# Patient Record
Sex: Male | Born: 1955
Health system: Southern US, Community
[De-identification: ages and names within clinical notes are randomized; demographics above are authoritative.]

## PROBLEM LIST (undated history)

## (undated) DIAGNOSIS — R7301 Impaired fasting glucose: Secondary | ICD-10-CM

## (undated) DIAGNOSIS — R0989 Other specified symptoms and signs involving the circulatory and respiratory systems: Secondary | ICD-10-CM

## (undated) DIAGNOSIS — F17200 Nicotine dependence, unspecified, uncomplicated: Secondary | ICD-10-CM

## (undated) DIAGNOSIS — A630 Anogenital (venereal) warts: Secondary | ICD-10-CM

## (undated) DIAGNOSIS — M109 Gout, unspecified: Secondary | ICD-10-CM

## (undated) DIAGNOSIS — K219 Gastro-esophageal reflux disease without esophagitis: Secondary | ICD-10-CM

## (undated) DIAGNOSIS — J309 Allergic rhinitis, unspecified: Secondary | ICD-10-CM

## (undated) DIAGNOSIS — I10 Essential (primary) hypertension: Secondary | ICD-10-CM

## (undated) DIAGNOSIS — E785 Hyperlipidemia, unspecified: Secondary | ICD-10-CM

## (undated) DIAGNOSIS — F419 Anxiety disorder, unspecified: Secondary | ICD-10-CM

## (undated) HISTORY — PX: OTHER SURGICAL HISTORY: SHX169

## (undated) HISTORY — DX: Impaired fasting glucose: R73.01

## (undated) HISTORY — DX: Hyperlipidemia, unspecified: E78.5

## (undated) HISTORY — DX: Essential (primary) hypertension: I10

## (undated) HISTORY — DX: Anxiety disorder, unspecified: F41.9

## (undated) HISTORY — DX: Gastro-esophageal reflux disease without esophagitis: K21.9

## (undated) HISTORY — DX: Anogenital (venereal) warts: A63.0

## (undated) HISTORY — DX: Gout, unspecified: M10.9

## (undated) HISTORY — PX: ESOPHAGOGASTRODUODENOSCOPY: SHX1529

## (undated) HISTORY — DX: Nicotine dependence, unspecified, uncomplicated: F17.200

## (undated) HISTORY — DX: Allergic rhinitis, unspecified: J30.9

## (undated) HISTORY — DX: Other specified symptoms and signs involving the circulatory and respiratory systems: R09.89

---

## 2011-05-19 HISTORY — PX: SKIN CANCER EXCISION: SHX779

## 2011-06-28 ENCOUNTER — Encounter: Payer: Self-pay | Admitting: Family Medicine

## 2011-06-28 ENCOUNTER — Telehealth: Payer: Self-pay | Admitting: *Deleted

## 2011-06-28 ENCOUNTER — Ambulatory Visit (INDEPENDENT_AMBULATORY_CARE_PROVIDER_SITE_OTHER): Payer: BC Managed Care – PPO | Admitting: Family Medicine

## 2011-06-28 VITALS — BP 220/127 | HR 78 | Ht 73.0 in | Wt 172.0 lb

## 2011-06-28 DIAGNOSIS — F17200 Nicotine dependence, unspecified, uncomplicated: Secondary | ICD-10-CM

## 2011-06-28 DIAGNOSIS — R0989 Other specified symptoms and signs involving the circulatory and respiratory systems: Secondary | ICD-10-CM

## 2011-06-28 DIAGNOSIS — I10 Essential (primary) hypertension: Secondary | ICD-10-CM | POA: Insufficient documentation

## 2011-06-28 DIAGNOSIS — M109 Gout, unspecified: Secondary | ICD-10-CM | POA: Insufficient documentation

## 2011-06-28 HISTORY — DX: Other specified symptoms and signs involving the circulatory and respiratory systems: R09.89

## 2011-06-28 MED ORDER — HYDROCODONE-ACETAMINOPHEN 5-500 MG PO TABS: 2.0000 | ORAL_TABLET | Freq: Four times a day (QID) | ORAL | Status: DC | PRN

## 2011-06-28 MED ORDER — PREDNISONE 20 MG PO TABS: ORAL_TABLET | ORAL | Status: DC

## 2011-06-28 MED ORDER — AMLODIPINE-OLMESARTAN 5-20 MG PO TABS: 1.0000 | ORAL_TABLET | Freq: Every day | ORAL | Status: DC

## 2011-06-28 NOTE — Telephone Encounter (Signed)
VM from pt.  He has questions regarding when to take new meds.  RC to pt.  Advised OK to take BP med today and then begin morning routine tomorrow.  Also advised pt that 40 mg of prednisone may make her hyper or jittery and may keep him up at night.  Recommended pt med in AM if he can hold off that long.  Pt will take in AM.  Pt will call if he has further questions or concerns.

## 2011-06-28 NOTE — Assessment & Plan Note (Signed)
Likely has had a couple of mild attacks in the past, spontaneously resolved. Check uric acid level, take prednisone 40mg  qd x 5d. Vicodin 5/500, 1-2 q6h prn pain, #20, no RF. Discussed avoidance of NSAIDs for now (while on prednisone).  He'll be monitoring bp. I need to give him handout on low purine diet at next visit.

## 2011-06-28 NOTE — Assessment & Plan Note (Addendum)
With his smoking hx and HTN I will make sure this is not a AAA.  Ultrasound ordered.

## 2011-06-28 NOTE — Assessment & Plan Note (Signed)
Stage 2, new dx.  No sign of LVH on EKG today. Start azor 5/20, 1 qd x 7d, then start 5/40 qd. Monitor bp at home, call if persistently >200/100. F/u in office in 7-10d. Patient will return for fasting labs in 2d: CMET, CBC, TSH, lipid panel.

## 2011-06-28 NOTE — Patient Instructions (Signed)
Take one Azor 5/20 tab daily for 7d, then start one Azor 5/40 tab once daily.   Follow up in the office in 7-10 days. Your Goal blood pressure is <130/80, but we'll take our time (likely a couple of weeks or more) getting there. Get a bp cuff/machine at your pharmacy if you can (typically 40$ or so) and check your bp once or twice daily until we get things controlled. Write these down and bring them with you to each office visit. Call if your bp is still consistently >200/100 in the next 1 week.

## 2011-06-28 NOTE — Progress Notes (Signed)
Office Note 06/28/2011  CC:  Chief Complaint  Patient presents with  . Hypertension    elevated at derm office, advised to see PCP  . Sinus Problem    sinus HA x 3 day  . Foot Pain    left foot, ? gout, began today    HPI:  Laterrance Nauta is a 55 y.o. White male who is here to establish care and discuss sinus HA, hypertension, and foot pain. Patient's most recent primary MD: Dr. Jacob Moores in [place], [place]. Old records were not reviewed prior to or during today's visit.  Reports some sporadic elevated bps at (infrequent) MD visits in the past, but says rechecks were normal so no meds ever started. Now has had a few bps in the last few weeks consistently around 200/100--has home bp cuff now and bp at home this am was 190s/100. Denies CP, SOB, palpitations, or LE edema.  No OTC decongestants lately.  He does drink coffee.  Last 3d has had increased runny nose/PND, sneezing.  Rare coughing, no fever.  +Retro-orbital headache x 3d.   No vision complaints or hearing complaints.    Past Medical History  Diagnosis Date  . GERD (gastroesophageal reflux disease)   . Anxiety   . Tobacco dependence     Past Surgical History  Procedure Date  . Skin cancer excision 05/2011    SSC from left side of face/neck    Family History  Problem Relation Age of Onset  . Hypertension Mother   . Cancer Father 36    lung cancer  . Hypertension Father   . Hypertension Paternal Grandfather     History   Social History  . Marital Status: Married    Spouse Name: Tammy    Number of Children: 1  . Years of Education: N/A   Occupational History  . Not on file.   Social History Main Topics  . Smoking status: Current Everyday Smoker -- 1.0 packs/day    Types: Cigarettes  . Smokeless tobacco: Never Used  . Alcohol Use: Yes     beer 2-3 nights week  . Drug Use: No  . Sexually Active: Not on file   Other Topics Concern  . 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 Drinks 3-4 beers several nights per week.  No hx of alc abuse or drug use.No exercise.    Outpatient Encounter Prescriptions as of 06/28/2011  Medication Sig Dispense Refill  . ALPRAZolam (XANAX) 0.25 MG tablet Take 0.25 mg by mouth as needed.        Marland Kitchen amLODipine-olmesartan (AZOR) 5-20 MG per tablet Take 1 tablet by mouth daily.  7 tablet  0  . esomeprazole (NEXIUM) 40 MG capsule Take 40 mg by mouth daily before breakfast.        . HYDROcodone-acetaminophen (VICODIN) 5-500 MG per tablet Take 2 tablets by mouth every 6 (six) hours as needed for pain.  20 tablet  0  . predniSONE (DELTASONE) 20 MG tablet 2 tabs po qd x 5d  10 tablet  0    No Known Allergies  ROS Review of Systems  Constitutional: Positive for fatigue ("washed out" feeling lately). Negative for fever, chills and appetite change.  HENT: Positive for congestion, rhinorrhea, sneezing and postnasal drip. Negative for ear pain, sore throat, neck stiffness  and dental problem.   Eyes: Negative for discharge, redness and visual disturbance.  Respiratory: Negative for cough, chest tightness, shortness of breath and wheezing.   Cardiovascular: Negative for chest pain, palpitations and leg swelling.  Gastrointestinal: Negative for nausea, vomiting, abdominal pain, diarrhea and blood in stool.  Genitourinary: Negative for dysuria, urgency, frequency, hematuria, flank pain and difficulty urinating.  Musculoskeletal: Positive for joint swelling (left MTP jt). Negative for myalgias, back pain and arthralgias.  Skin: Negative for pallor and rash.  Neurological: Negative for dizziness, speech difficulty, weakness and headaches.  Hematological: Negative for adenopathy. Does not bruise/bleed easily.  Psychiatric/Behavioral: Negative for confusion and sleep disturbance. The  patient is not nervous/anxious.      PE; Blood pressure 220/127, pulse 78, height 6\' 1"  (1.854 m), weight 172 lb (78.019 kg), SpO2 100.00%. Gen: Alert, well appearing.  Patient is oriented to person, place, time, and situation. HEENT: Scalp without lesions or hair loss.  Ears: EACs clear, normal epithelium.  TMs with good light reflex and landmarks bilaterally.  Eyes: no injection, icteris, swelling, or exudate.  EOMI, PERRLA. Nose: no drainage or turbinate edema/swelling.  No injection or focal lesion.  Mouth: lips without lesion/swelling.  Oral mucosa pink and moist.  Dentition intact and without obvious caries or gingival swelling.  Oropharynx without erythema, exudate, or swelling.  Neck: supple, ROM full.  Carotids 2+ bilat, without bruit.  No lymphadenopathy, thyromegaly, or mass. Chest: symmetric expansion, nonlabored respirations.  Clear and equal breath sounds in all lung fields.   CV: RRR, no m/r/g.  Peripheral pulses 2+ and symmetric. ABD: soft, NT, ND, BS normal.  No hepatospenomegaly.  No bruits.  He has a pulsatile abdominal aorta palpable just above the umbillicus level. EXT: no clubbing, cyanosis, or edema.   Pertinent labs:  12 lead EKG: NSR, no LVH or ischemic changes.  No old for comparison.  ASSESSMENT AND PLAN:   HTN (hypertension), benign Stage 2, new dx.  No sign of LVH on EKG today. Start azor 5/20, 1 qd x 7d, then start 5/40 qd. Monitor bp at home, call if persistently >200/100. F/u in office in 7-10d. Patient will return for fasting labs in 2d: CMET, CBC, TSH, lipid panel.  Gout Likely has had a couple of mild attacks in the past, spontaneously resolved. Check uric acid level, take prednisone 40mg  qd x 5d. Vicodin 5/500, 1-2 q6h prn pain, #20, no RF. Discussed avoidance of NSAIDs for now (while on prednisone).  He'll be monitoring bp. I need to give him handout on low purine diet at next visit.  Palpable abdominal aorta With his smoking hx and HTN I will  make sure this is not a AAA.  Ultrasound ordered.     No Follow-up on file.

## 2011-06-29 ENCOUNTER — Encounter: Payer: Self-pay | Admitting: Family Medicine

## 2011-06-30 ENCOUNTER — Other Ambulatory Visit (INDEPENDENT_AMBULATORY_CARE_PROVIDER_SITE_OTHER): Payer: BC Managed Care – PPO

## 2011-06-30 ENCOUNTER — Other Ambulatory Visit: Payer: Self-pay | Admitting: Cardiology

## 2011-06-30 DIAGNOSIS — I10 Essential (primary) hypertension: Secondary | ICD-10-CM

## 2011-06-30 DIAGNOSIS — M109 Gout, unspecified: Secondary | ICD-10-CM

## 2011-06-30 LAB — LIPID PANEL
Cholesterol: 181 mg/dL (ref 0–200)
Total CHOL/HDL Ratio: 4
Triglycerides: 93 mg/dL (ref 0.0–149.0)

## 2011-06-30 LAB — COMPREHENSIVE METABOLIC PANEL
ALT: 22 U/L (ref 0–53)
AST: 16 U/L (ref 0–37)
Alkaline Phosphatase: 104 U/L (ref 39–117)
CO2: 30 mEq/L (ref 19–32)
Creatinine, Ser: 1.1 mg/dL (ref 0.4–1.5)
GFR: 78.39 mL/min (ref >60.00)
Sodium: 142 mEq/L (ref 135–145)
Total Bilirubin: 1.1 mg/dL (ref 0.3–1.2)
Total Protein: 7.1 g/dL (ref 6.0–8.3)

## 2011-06-30 LAB — CBC WITH DIFFERENTIAL/PLATELET
Basophils Absolute: 0 10*3/uL (ref 0.0–0.1)
Eosinophils Absolute: 0 10*3/uL (ref 0.0–0.7)
HCT: 46.4 % (ref 39.0–52.0)
Hemoglobin: 16 g/dL (ref 13.0–17.0)
Lymphs Abs: 0.8 10*3/uL (ref 0.7–4.0)
MCHC: 34.5 g/dL (ref 30.0–36.0)
MCV: 89.9 fl (ref 78.0–100.0)
Monocytes Absolute: 0.3 10*3/uL (ref 0.1–1.0)
Monocytes Relative: 3.3 % (ref 3.0–12.0)
Neutro Abs: 7.1 10*3/uL (ref 1.4–7.7)
Platelets: 266 10*3/uL (ref 150.0–400.0)
RDW: 13.6 % (ref 11.5–14.6)

## 2011-07-03 ENCOUNTER — Encounter (INDEPENDENT_AMBULATORY_CARE_PROVIDER_SITE_OTHER): Payer: BC Managed Care – PPO | Admitting: Cardiology

## 2011-07-03 DIAGNOSIS — I10 Essential (primary) hypertension: Secondary | ICD-10-CM

## 2011-07-03 DIAGNOSIS — I700 Atherosclerosis of aorta: Secondary | ICD-10-CM

## 2011-07-05 ENCOUNTER — Encounter: Payer: Self-pay | Admitting: Family Medicine

## 2011-07-05 ENCOUNTER — Ambulatory Visit (INDEPENDENT_AMBULATORY_CARE_PROVIDER_SITE_OTHER): Payer: BC Managed Care – PPO | Admitting: Family Medicine

## 2011-07-05 DIAGNOSIS — I10 Essential (primary) hypertension: Secondary | ICD-10-CM

## 2011-07-05 DIAGNOSIS — M109 Gout, unspecified: Secondary | ICD-10-CM

## 2011-07-05 MED ORDER — COLCHICINE 0.6 MG PO TABS: 0.6000 mg | ORAL_TABLET | Freq: Two times a day (BID) | ORAL | Status: DC

## 2011-07-05 MED ORDER — AMLODIPINE-OLMESARTAN 5-40 MG PO TABS: 1.0000 | ORAL_TABLET | Freq: Every day | ORAL | Status: DC

## 2011-07-05 NOTE — Patient Instructions (Signed)
Hypertension (High Blood Pressure) As your heart beats, it forces blood through your arteries. This force is your blood pressure. If the pressure is too high, it is called hypertension (HTN) or high blood pressure. HTN is dangerous because you may have it and not know it. High blood pressure may mean that your heart has to work harder to pump blood. Your arteries may be narrow or stiff. The extra work puts you at risk for heart disease, stroke, and other problems.  Blood pressure consists of two numbers, a higher number over a lower, 110/72, for example. It is stated as "110 over 72." The ideal is below 120 for the top number (systolic) and under 80 for the bottom (diastolic). Write down your blood pressure today. You should pay close attention to your blood pressure if you have certain conditions such as:  Heart failure.  Prior heart attack.   Diabetes   Chronic kidney disease.   Prior stroke.   Multiple risk factors for heart disease.   To see if you have HTN, your blood pressure should be measured while you are seated with your arm held at the level of the heart. It should be measured at least twice. A one-time elevated blood pressure reading (especially in the Emergency Department) does not mean that you need treatment. There may be conditions in which the blood pressure is different between your right and left arms. It is important to see your caregiver soon for a recheck. Most people have essential hypertension which means that there is not a specific cause. This type of high blood pressure may be lowered by changing lifestyle factors such as:  Stress.  Smoking.   Lack of exercise.   Excessive weight.  Drug/tobacco/alcohol use.   Eating less salt.   Most people do not have symptoms from high blood pressure until it has caused damage to the body. Effective treatment can often prevent, delay or reduce that damage. TREATMENT Treatment for high blood pressure, when a cause has been  identified, is directed at the cause. There are a large number of medications to treat HTN. These fall into several categories, and your caregiver will help you select the medicines that are best for you. Medications may have side effects. You should review side effects with your caregiver. If your blood pressure stays high after you have made lifestyle changes or started on medicines,   Your medication(s) may need to be changed.   Other problems may need to be addressed.   Be certain you understand your prescriptions, and know how and when to take your medicine.   Be sure to follow up with your caregiver within the time frame advised (usually within two weeks) to have your blood pressure rechecked and to review your medications.   If you are taking more than one medicine to lower your blood pressure, make sure you know how and at what times they should be taken. Taking two medicines at the same time can result in blood pressure that is too low.  SEEK IMMEDIATE MEDICAL CARE IF YOU DEVELOP:  A severe headache, blurred or changing vision, or confusion.   Unusual weakness or numbness, or a faint feeling.   Severe chest or abdominal pain, vomiting, or breathing problems.  MAKE SURE YOU:   Understand these instructions.   Will watch your condition.   Will get help right away if you are not doing well or get worse.  Document Released: 12/04/2005 Document Re-Released: 05/24/2010 [employer] Patient Information 2011 [employer]   [employer].Gout Gout is an inflammatory condition (arthritis) caused by a buildup of uric acid crystals in the joints. Uric acid is a chemical that is normally present in the blood. Under some circumstances, uric acid can form into crystals in your joints. This causes joint redness, soreness, and swelling (inflammation). Repeat attacks are common. Over time, uric acid crystals can form into masses (tophi) near a joint, causing disfigurement. Gout is treatable and often  preventable. CAUSES The disease begins with elevated levels of uric acid in the blood. Uric acid is produced by your body when it breaks down a naturally found substance called purines. This also happens when you eat certain foods such as meats and fish. Causes of an elevated uric acid level include:  Being passed down from parent to child (heredity).   Diseases that cause increased uric acid production (obesity, psoriasis, some cancers).   Excessive alcohol use.   Diet, especially diets rich in meat and seafood.   Medicines, including certain cancer-fighting drugs (chemotherapy), diuretics, and aspirin.   Chronic kidney disease. The kidneys are no longer able to remove uric acid well.   Problems with metabolism.  Conditions strongly associated with gout include:  Obesity.   High blood pressure.   High cholesterol.   Diabetes.  Not everyone with elevated uric acid levels gets gout. It is not understood why some people get gout and others do not. Surgery, joint injury, and eating too much of certain foods are some of the factors that can lead to gout. SYMPTOMS  An attack of gout comes on quickly. It causes intense pain with redness, swelling, and warmth in a joint.   Fever can occur.   Often, only one joint is involved. Certain joints are more commonly involved:   Base of the big toe.   Knee.   Ankle.   Wrist.   Finger.  Without treatment, an attack usually goes away in a few days to weeks. Between attacks, you usually will not have symptoms, which is different from many other forms of arthritis. DIAGNOSIS Your caregiver will suspect gout based on your symptoms and exam. Removal of fluid from the joint (arthrocentesis) is done to check for uric acid crystals. Your caregiver will give you a medicine that numbs the area (local anesthetic) and use a needle to remove joint fluid for exam. Gout is confirmed when uric acid crystals are seen in joint fluid, using a special  microscope. Sometimes, blood, urine, and X-ray tests are also used. TREATMENT There are 2 phases to gout treatment: treating the sudden onset (acute) attack and preventing attacks (prophylaxis). Treatment of an Acute Attack  Medicines are used. These include anti-inflammatory medicines or steroid medicines.   An injection of steroid medicine into the affected joint is sometimes necessary.   The painful joint is rested. Movement can worsen the arthritis.   You may use warm or cold treatments on painful joints, depending which works best for you.   Discuss the use of coffee, vitamin C, or cherries with your caregiver. These may be helpful treatment options.  Treatment to Prevent Attacks After the acute attack subsides, your caregiver may advise prophylactic medicine. These medicines either help your kidneys eliminate uric acid from your body or decrease your uric acid production. You may need to stay on these medicines for a very long time. The early phase of treatment with prophylactic medicine can be associated with an increase in acute gout attacks. For this reason, during the first few months of treatment, your  caregiver may also advise you to take medicines usually used for acute gout treatment. Be sure you understand your caregiver's directions. You should also discuss dietary treatment with your caregiver. Certain foods such as meats and fish can increase uric acid levels. Other foods such as dairy can decrease levels. Your caregiver can give you a list of foods to avoid. HOME CARE INSTRUCTIONS  Do not take aspirin to relieve pain. This raises uric acid levels.   Only take over-the-counter or prescription medicines for pain, discomfort, or fever as directed by your caregiver.   Rest the joint as much as possible. When in bed, keep sheets and blankets off painful areas.   Keep the affected joint raised (elevated).   Use crutches if the painful joint is in your leg.   Drink enough  water and fluids to keep your urine clear or pale yellow. This helps your body get rid of uric acid. Do not drink alcoholic beverages. They slow the passage of uric acid.   Follow your caregiver's dietary instructions. Pay careful attention to the amount of protein you eat. Your daily diet should emphasize fruits, vegetables, whole grains, and fat-free or low-fat milk products.   Maintain a healthy body weight.  SEEK MEDICAL CARE IF:  You have an oral temperature above 104.   You develop diarrhea, vomiting, or any side effects from medicines.   You do not feel better in 24 hours, or you are getting worse.  SEEK IMMEDIATE MEDICAL CARE IF:  Your joint becomes suddenly more tender and you have:   Chills.   An oral temperature above 104, not controlled by medicine.  MAKE SURE YOU:  Understand these instructions.   Will watch your condition.   Will get help right away if you are not doing well or get worse.  Document Released: 12/01/2000 Document Re-Released: 05/24/2010 [facility] Patient Information 2011 [place], [place].

## 2011-07-06 ENCOUNTER — Encounter: Payer: Self-pay | Admitting: Family Medicine

## 2011-07-06 DIAGNOSIS — M109 Gout, unspecified: Secondary | ICD-10-CM | POA: Insufficient documentation

## 2011-07-06 NOTE — Assessment & Plan Note (Signed)
Improved pain and swelling on Prednisone but not fully resolved and since stopping the Prednisone the pain has increased slightly. He is asked to hydrate well avoid excessive, and he start on Procrit 0.6 mg twice a day when necessary. He will notify us if symptoms continue to worsen or do not resolve.

## 2011-07-06 NOTE — Progress Notes (Signed)
Todd Garner [id number] [birth year] 07/06/2011      Progress Note-Follow Up  Subjective  Chief Complaint  Chief Complaint  Patient presents with  . Follow-up    7-10 day follow up    HPI  Patient is in today for followup on his blood pressure upon arrival he is 200/95 but was in a rush to get here on repeat is 170/92. Unfortunately he did not take his usual 5/20 daily. Is taking it daily until yesterday when he ran out. His last dose was yesterday. He reports his blood pressures were in the 150s to 170s over 70s to 80s while he was taking it daily he reports he fell while. He denies chest pain, palpitations, headache, cough, GI or GU complaints on the new medication. He was also noted to have foot pain a gouty flare at the last visit with swelling and pain. He was given 5 days with the prednisone and the pain and swelling improved significantly but not completely. After he stopped the prednisone he thinks the pain hasworsened slightly as well.  Past Medical History  Diagnosis Date  . GERD (gastroesophageal reflux disease)   . Anxiety   . Tobacco dependence     Past Surgical History  Procedure Date  . Skin cancer excision 05/2011    SSC from left side of face/neck    Family History  Problem Relation Age of Onset  . Hypertension Mother   . Cancer Father 24    lung cancer  . Hypertension Father   . Hypertension Paternal Grandfather     History   Social History  . Marital Status: Married    Spouse Name: Tammy    Number of Children: 1  . Years of Education: N/A   Occupational History  . Not on file.   Social History Main Topics  . Smoking status: Current Everyday Smoker -- 1.0 packs/day    Types: Cigarettes  . Smokeless tobacco: Never Used  . Alcohol Use: Yes     beer 2-3 nights week  . Drug Use: No  . Sexually Active: Not on file   Other Topics Concern  . 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 Drinks 3-4 beers several nights per week.  No hx of alc abuse or drug use.No exercise.    Current Outpatient Prescriptions on File Prior to Visit  Medication Sig Dispense Refill  . ALPRAZolam (XANAX) 0.25 MG tablet Take 0.25 mg by mouth as needed.        Marland Kitchen esomeprazole (NEXIUM) 40 MG capsule Take 40 mg by mouth daily before breakfast.        . HYDROcodone-acetaminophen (VICODIN) 5-500 MG per tablet Take 2 tablets by mouth every 6 (six) hours as needed for pain.  20 tablet  0    Allergies  Allergen Reactions  . Codeine     Review of Systems  Review of Systems  Constitutional: Negative for fever and malaise/fatigue.  HENT: Negative for congestion.   Eyes: Negative for discharge.  Respiratory: Negative for shortness of breath.   Cardiovascular: Negative for chest pain, palpitations and leg swelling.  Gastrointestinal: Negative for nausea, abdominal pain and diarrhea.  Genitourinary: Negative for dysuria.  Musculoskeletal: Negative for falls.       Foot pain  Skin: Negative for rash.  Neurological: Negative for loss  of consciousness and headaches.  Endo/Heme/Allergies: Negative for polydipsia.  Psychiatric/Behavioral: Negative for depression and suicidal ideas. The patient is not nervous/anxious and does not have insomnia.     Objective  BP 200/95  Pulse 88  Temp(Src) 98.1 F (36.7 C) (Oral)  Ht 6\' 1"  (1.854 m)  Wt 169 lb (76.658 kg)  BMI 22.30 kg/m2  SpO2 97%  Physical Exam  Physical Exam  Constitutional: He is oriented to person, place, and time and well-developed, well-nourished, and in no distress. No distress.  HENT:  Head: Normocephalic and atraumatic.  Eyes: Conjunctivae are normal.  Neck: Neck supple. No thyromegaly present.  Cardiovascular: Normal rate, regular rhythm and normal heart sounds.   No murmur heard. Pulmonary/Chest:  Effort normal and breath sounds normal. No respiratory distress.  Abdominal: He exhibits no distension and no mass. There is no tenderness.  Musculoskeletal: He exhibits no edema.  Neurological: He is alert and oriented to person, place, and time.  Skin: Skin is warm.  Psychiatric: Memory, affect and judgment normal.    Lab Results  Component Value Date   TSH 0.33* 06/30/2011   Lab Results  Component Value Date   WBC 8.1 06/30/2011   HGB 16.0 06/30/2011   HCT 46.4 06/30/2011   MCV 89.9 06/30/2011   PLT 266.0 06/30/2011   Lab Results  Component Value Date   CREATININE 1.1 06/30/2011   BUN 18 06/30/2011   NA 142 06/30/2011   K 4.6 06/30/2011   CL 108 06/30/2011   CO2 30 06/30/2011   Lab Results  Component Value Date   ALT 22 06/30/2011   AST 16 06/30/2011   ALKPHOS 104 06/30/2011   BILITOT 1.1 06/30/2011   Lab Results  Component Value Date   CHOL 181 06/30/2011   Lab Results  Component Value Date   HDL 49.90 06/30/2011   Lab Results  Component Value Date   LDLCALC 113* 06/30/2011   Lab Results  Component Value Date   TRIG 93.0 06/30/2011   Lab Results  Component Value Date   CHOLHDL 4 06/30/2011     Assessment & Plan  HTN (hypertension), benign Patient had been taking his Azor 5-20 dosing until today (last dose). Felt well on it but his numbers were still running hi in the 150 to 170 range systolic and 70-80 range diastolic. He did not take the pill as he was out and he was unsure if he should go up to the 5/40. At this time I will schedule increased Azor to the 5/40 take 1 daily check his blood pressure daily and notify us if he has any untoward side effects or if he feels his pressures running too high or too low. We will schedule him for a followup blood pressure check in about 2 weeks and he will come in sooner as needed.  Gout attack Improved pain and swelling on Prednisone but not fully resolved and since stopping the Prednisone the pain has increased slightly. He is  asked to hydrate well avoid excessive, and he start on Procrit 0.6 mg twice a day when necessary. He will notify us if symptoms continue to worsen or do not resolve.

## 2011-07-06 NOTE — Assessment & Plan Note (Signed)
Patient had been taking his Azor 5-20 dosing until today (last dose). Felt well on it but his numbers were still running hi in the 150 to 170 range systolic and 70-80 range diastolic. He did not take the pill as he was out and he was unsure if he should go up to the 5/40. At this time I will schedule increased Azor to the 5/40 take 1 daily check his blood pressure daily and notify us if he has any untoward side effects or if he feels his pressures running too high or too low. We will schedule him for a followup blood pressure check in about 2 weeks and he will come in sooner as needed.

## 2011-07-09 ENCOUNTER — Encounter: Payer: Self-pay | Admitting: Family Medicine

## 2011-07-13 ENCOUNTER — Other Ambulatory Visit: Payer: BC Managed Care – PPO

## 2011-07-13 DIAGNOSIS — E0590 Thyrotoxicosis, unspecified without thyrotoxic crisis or storm: Secondary | ICD-10-CM

## 2011-07-14 ENCOUNTER — Other Ambulatory Visit: Payer: Self-pay | Admitting: Family Medicine

## 2011-07-14 ENCOUNTER — Telehealth: Payer: Self-pay

## 2011-07-14 MED ORDER — PREDNISONE 20 MG PO TABS: ORAL_TABLET | ORAL | Status: DC

## 2011-07-14 NOTE — Telephone Encounter (Signed)
Talked to pt on phone, confirmed classic gout sx's in same toe as before. Colchicine at 0.6mg  bid in the recent past caused diarrhea so he stopped taking it.   He is out of town helping his daughter move and has colchicine with him.  He is willing to retry colchicine (1.2mg  now, followed by 0.6mg  an hour later, then start 0.6mg  bid tomorrow) and if it is not much improved by the time he gets back in town in 2 days then he'll rx for prednisone that I'll eRx to his local pharmacy (10 day taper).  If this episode resolves incompletely like the last one or if he gets a recurrence shortly after it resolves, then I told him I recommend we start a prophylactic gout medication.--PM

## 2011-07-14 NOTE — Telephone Encounter (Signed)
Pt is calling stating that today (4 days later) his gout is bothering him worse? Pt states he thought that you take more than 2 pills to begin with then wean down to 2? He's wandering if the 2 is going to help the gout or if he should be taking more? Please advise?

## 2011-07-18 ENCOUNTER — Encounter: Payer: Self-pay | Admitting: Family Medicine

## 2011-07-19 ENCOUNTER — Encounter: Payer: Self-pay | Admitting: Family Medicine

## 2011-07-19 ENCOUNTER — Ambulatory Visit (INDEPENDENT_AMBULATORY_CARE_PROVIDER_SITE_OTHER): Payer: BC Managed Care – PPO | Admitting: Family Medicine

## 2011-07-19 DIAGNOSIS — M109 Gout, unspecified: Secondary | ICD-10-CM

## 2011-07-19 DIAGNOSIS — I10 Essential (primary) hypertension: Secondary | ICD-10-CM

## 2011-07-19 NOTE — Assessment & Plan Note (Signed)
Improved, but there is some discrepancy between his bp cuff readings and ours here in the office.   With some of his home bp checks being 100 systolic, I am hesitant to increase bp med at this time w/out convincing evidence of persistent elevation.  Will continue current med, Azor 5/40 qd--40mo worth of samples given again today.  I asked him to compare his cuff readings to a drug-store cuff or come by here 1-2 times per week so we can try to get clearer evidence of ongoing HTN and possible need for up titration of med.  Encouraged smoking cessation again and he is slowly making changes.

## 2011-07-19 NOTE — Progress Notes (Signed)
OFFICE NOTE  07/19/2011  CC:  Chief Complaint  Patient presents with  . Hypertension  . Gout     HPI:   Patient is a 55 y.o. Caucasian male who is here for f/u HTN and gout. Home bp checks with upper arm cuff show 120s/70s in evenings per pt.  No HAs, dizziness, chest pain, SOB, or fatigue. Says gout flare in toe MUCH improved, still some soreness but no swelling or tenderness or redness. He has been tolerating the colchicine 0.6mg  bid now and didn't have to take the prednisone I recently called in---he did fill the rx though. Still smoking but trying to cut back slowly. We reviewed his labs from last visit: TSH a touch low but T4 and T3 normal.  Repeat TSH in low-normal range 2 wks later. Plan to repeat TSH 75mo.   Aortic u/s normal.  Pertinent PMH:  HTN Pulsatile abd aorta Gout  MEDS;   Outpatient Prescriptions Prior to Visit  Medication Sig Dispense Refill  . ALPRAZolam (XANAX) 0.25 MG tablet Take 0.25 mg by mouth as needed.        Marland Kitchen amLODipine-olmesartan (AZOR) 5-40 MG per tablet Take 1 tablet by mouth daily.  14 tablet    . colchicine 0.6 MG tablet Take 1 tablet (0.6 mg total) by mouth 2 (two) times daily. Prn gout  60 tablet  1  . esomeprazole (NEXIUM) 40 MG capsule Take 40 mg by mouth daily before breakfast.        . HYDROcodone-acetaminophen (VICODIN) 5-500 MG per tablet Take 2 tablets by mouth every 6 (six) hours as needed for pain.  20 tablet  0  . predniSONE (DELTASONE) 20 MG tablet 2 tabs po qd x 5d, then 1 tab po qd x 5d  15 tablet  0    PE: Blood pressure 150/92, pulse 81, height 6\' 1"  (1.854 m), weight 169 lb (76.658 kg). Repeat bp 153/86 on HIS CUFF, our cuff 130/84. Gen: Alert, well appearing.  Patient is oriented to person, place, time, and situation. Chest: symmetric expansion, nonlabored respirations.  Clear and equal breath sounds in all lung fields.   CV: RRR, no m/r/g.  Peripheral pulses 2+ and symmetric.   IMPRESSION AND PLAN:  HTN (hypertension),  benign Improved, but there is some discrepancy between his bp cuff readings and ours here in the office.   With some of his home bp checks being 100 systolic, I am hesitant to increase bp med at this time w/out convincing evidence of persistent elevation.  Will continue current med, Azor 5/40 qd--175mo worth of samples given again today.  I asked him to compare his cuff readings to a drug-store cuff or come by here 1-2 times per week so we can try to get clearer evidence of ongoing HTN and possible need for up titration of med.  Encouraged smoking cessation again and he is slowly making changes.  Gout attack Resolving. His uric acid was just a bit over 6. Given his prolonged/?recurrent gouty flare lately I asked him to continue colchicine 0.6mg  bid until his toe no longer hurts at all, with consideration of continuing this as bid prophylactic afterwards.  He has a course of prednisone to take if he thinks he needs it. He'll call or return if any questions.   Recent low TSH, normal T4 and T3, with f/u TSH low-normal.  Plan to repeat TSH in 75mo.  Pulsatile abd aorta: normal aortic u/s--reassured pt.  FOLLOW UP:  Return in about 2 months (around  09/18/2011) for f/u htn.

## 2011-07-19 NOTE — Assessment & Plan Note (Signed)
Resolving. His uric acid was just a bit over 6. Given his prolonged/?recurrent gouty flare lately I asked him to continue colchicine 0.6mg  bid until his toe no longer hurts at all, with consideration of continuing this as bid prophylactic afterwards.  He has a course of prednisone to take if he thinks he needs it. He'll call or return if any questions.

## 2011-08-25 ENCOUNTER — Telehealth: Payer: Self-pay | Admitting: Family Medicine

## 2011-08-25 NOTE — Telephone Encounter (Signed)
Left a message for pt to return our call.

## 2011-08-25 NOTE — Telephone Encounter (Signed)
Patient wants to speak to the Dr. would not say why.

## 2011-09-15 ENCOUNTER — Ambulatory Visit (INDEPENDENT_AMBULATORY_CARE_PROVIDER_SITE_OTHER): Payer: BC Managed Care – PPO | Admitting: Family Medicine

## 2011-09-15 ENCOUNTER — Encounter: Payer: Self-pay | Admitting: Family Medicine

## 2011-09-15 VITALS — BP 136/70 | HR 74 | Temp 98.3°F | Wt 165.0 lb

## 2011-09-15 DIAGNOSIS — Z23 Encounter for immunization: Secondary | ICD-10-CM

## 2011-09-15 DIAGNOSIS — I10 Essential (primary) hypertension: Secondary | ICD-10-CM

## 2011-09-15 DIAGNOSIS — Z1211 Encounter for screening for malignant neoplasm of colon: Secondary | ICD-10-CM

## 2011-09-15 DIAGNOSIS — M109 Gout, unspecified: Secondary | ICD-10-CM

## 2011-09-15 DIAGNOSIS — F17200 Nicotine dependence, unspecified, uncomplicated: Secondary | ICD-10-CM

## 2011-09-15 MED ORDER — AMLODIPINE-OLMESARTAN 10-40 MG PO TABS: 1.0000 | ORAL_TABLET | Freq: Every day | ORAL | Status: DC

## 2011-09-16 DIAGNOSIS — F17200 Nicotine dependence, unspecified, uncomplicated: Secondary | ICD-10-CM | POA: Insufficient documentation

## 2011-09-16 DIAGNOSIS — Z1211 Encounter for screening for malignant neoplasm of colon: Secondary | ICD-10-CM | POA: Insufficient documentation

## 2011-09-16 NOTE — Assessment & Plan Note (Signed)
He has not yet had his initial colonoscopy for screening. Again, pt not ready to commit fully to referral yet.  Will touch on this topic each f/u visit, and he can always call to request the GI referral if needed.

## 2011-09-16 NOTE — Assessment & Plan Note (Signed)
Discussed cessation options today in detail.  Counseled patient on the best option for him (I recommended bupropion SR 150mg  bid x [redacted] wks along with use of nicotine 24H patch qd as well as use of nicotine gum or lozenge prn.  He is contemplating this recommendation but did not commit to anything today. He'll call back if he wants to start this: he plans on picking a quit date with his wife soon. Spent 15 min of our visit in discussion/counseling/management of this problem today.

## 2011-09-16 NOTE — Assessment & Plan Note (Signed)
Stable/quiescent w/out prophylaxis. Reviewed plan for acute flare treatment.

## 2011-09-16 NOTE — Progress Notes (Signed)
OFFICE NOTE  09/16/2011  CC:  Chief Complaint  Patient presents with  . Hypertension     HPI:   Patient is a 55 y.o. Caucasian male who is here for f/u HTN and gout. No acute complaints.  Compliant with azor w/out side effect.  Pt reports home bp checks 3x/week, ranges 130s-150s systolic, 80s-90s diastolic. No HA, dizziness, SOB, or chest pains. Smokes 1 pack/day of winstons.  Multiple attempts to quit in past, has tried nicotine gum which helped some.  Wants to quit again so we discussed med assistance options today.  He smokes a cig w/in 5-10 minutes of getting out of bed each day.  Wife smokes, too.  Gout has been quiescent since last visit and he has not been taking any colchicine for prophylaxis.  ROS: mildly painful hemorrhoids on and off lately, without bleeding.  Denies constipation.  Pertinent PMH:  HTN--started bp med 06/2011 Gout GERD Anxiety tob dependence.  MEDS;  Of note: pt not taking vicodin or prednisone listed below.  Takes nexium prn only. Outpatient Prescriptions Prior to Visit  Medication Sig Dispense Refill  . ALPRAZolam (XANAX) 0.25 MG tablet Take 0.25 mg by mouth as needed.        Marland Kitchen esomeprazole (NEXIUM) 40 MG capsule Take 40 mg by mouth daily before breakfast.        . amLODipine-olmesartan (AZOR) 5-40 MG per tablet Take 1 tablet by mouth daily.  14 tablet    . colchicine 0.6 MG tablet Take 1 tablet (0.6 mg total) by mouth 2 (two) times daily. Prn gout  60 tablet  1  . HYDROcodone-acetaminophen (VICODIN) 5-500 MG per tablet Take 2 tablets by mouth every 6 (six) hours as needed for pain.  20 tablet  0  . predniSONE (DELTASONE) 20 MG tablet 2 tabs po qd x 5d, then 1 tab po qd x 5d  15 tablet  0    PE: Blood pressure 136/70, pulse 74, temperature 98.3 F (36.8 C), temperature source Oral, weight 165 lb (74.844 kg), SpO2 97.00%. Gen: Alert, well appearing.  Patient is oriented to person, place, time, and situation. No further exam today.  IMPRESSION  AND PLAN:  HTN (hypertension), benign Improved but will continue to titrate meds: increased azor to 10/40 qd today. BP goal of <130/80 reviewed. DASH diet handout explained and handed to pt today. I told him to go ahead and start ASA 81mg  qd for primary prevention.  Gout Stable/quiescent w/out prophylaxis. Reviewed plan for acute flare treatment.  Tobacco dependence Discussed cessation options today in detail.  Counseled patient on the best option for him (I recommended bupropion SR 150mg  bid x [redacted] wks along with use of nicotine 24H patch qd as well as use of nicotine gum or lozenge prn.  He is contemplating this recommendation but did not commit to anything today. He'll call back if he wants to start this: he plans on picking a quit date with his wife soon. Spent 15 min of our visit in discussion/counseling/management of this problem today.  Colon cancer screening He has not yet had his initial colonoscopy for screening. Again, pt not ready to commit fully to referral yet.  Will touch on this topic each f/u visit, and he can always call to request the GI referral if needed.   Tdap given today.  He declined the flu vaccine today.  FOLLOW UP:  Return in about 4 months (around 01/15/2012) for f/u HTN, tob dependence, and gout.

## 2011-09-16 NOTE — Assessment & Plan Note (Signed)
Improved but will continue to titrate meds: increased azor to 10/40 qd today. BP goal of <130/80 reviewed. DASH diet handout explained and handed to pt today. I told him to go ahead and start ASA 81mg  qd for primary prevention.

## 2011-09-18 ENCOUNTER — Ambulatory Visit: Payer: BC Managed Care – PPO | Admitting: Family Medicine

## 2012-01-10 ENCOUNTER — Telehealth: Payer: Self-pay | Admitting: *Deleted

## 2012-01-10 MED ORDER — AMLODIPINE-OLMESARTAN 10-40 MG PO TABS: 1.0000 | ORAL_TABLET | Freq: Every day | ORAL | Status: DC

## 2012-01-10 NOTE — Telephone Encounter (Signed)
Pt states RX for Azor should have been sent to [facility].  RX sent.  Pt aware.

## 2012-06-18 ENCOUNTER — Other Ambulatory Visit: Payer: Self-pay | Admitting: Family Medicine

## 2012-06-18 ENCOUNTER — Ambulatory Visit (INDEPENDENT_AMBULATORY_CARE_PROVIDER_SITE_OTHER): Payer: BC Managed Care – PPO | Admitting: Family Medicine

## 2012-06-18 ENCOUNTER — Encounter: Payer: Self-pay | Admitting: Family Medicine

## 2012-06-18 VITALS — BP 145/86 | HR 80 | Ht 73.0 in | Wt 170.0 lb

## 2012-06-18 DIAGNOSIS — Z0000 Encounter for general adult medical examination without abnormal findings: Secondary | ICD-10-CM

## 2012-06-18 DIAGNOSIS — I10 Essential (primary) hypertension: Secondary | ICD-10-CM

## 2012-06-18 DIAGNOSIS — F17200 Nicotine dependence, unspecified, uncomplicated: Secondary | ICD-10-CM

## 2012-06-18 DIAGNOSIS — Z1211 Encounter for screening for malignant neoplasm of colon: Secondary | ICD-10-CM

## 2012-06-18 LAB — BASIC METABOLIC PANEL
BUN: 18 mg/dL (ref 6–23)
CO2: 28 mEq/L (ref 19–32)
Calcium: 9.1 mg/dL (ref 8.4–10.5)
Chloride: 105 mEq/L (ref 96–112)
Creatinine, Ser: 1.1 mg/dL (ref 0.4–1.5)
Glucose, Bld: 110 mg/dL — ABNORMAL HIGH (ref 70–99)

## 2012-06-18 MED ORDER — AMLODIPINE-OLMESARTAN 10-40 MG PO TABS: 1.0000 | ORAL_TABLET | Freq: Every day | ORAL | Status: DC

## 2012-06-18 MED ORDER — ESOMEPRAZOLE MAGNESIUM 40 MG PO CPDR: 40.0000 mg | DELAYED_RELEASE_CAPSULE | Freq: Every day | ORAL | Status: DC

## 2012-06-18 MED ORDER — ALPRAZOLAM 0.25 MG PO TABS: 0.2500 mg | ORAL_TABLET | ORAL | Status: DC | PRN

## 2012-06-18 NOTE — Assessment & Plan Note (Signed)
Stable.  Colchicine prn works well for him at this point.

## 2012-06-18 NOTE — Assessment & Plan Note (Signed)
He's getting closer to quitting completely.  I encouraged him to continue to attempt to totally quit.

## 2012-06-18 NOTE — Assessment & Plan Note (Signed)
Discussed need for this again today. He wants to defer this again at this time, wait until things are less busy at work and more convenient for him. I mentioned the iFOB test as something we could do in the meantime and he declined this as well. Will take this up again at next f/u.

## 2012-06-18 NOTE — Progress Notes (Signed)
OFFICE VISIT  06/18/2012   CC:  Chief Complaint  Todd Garner presents with  . Follow-up    HTN, needs refill Axor, Nexium     HPI:    Todd Garner is a 56 y.o. Caucasian male who presents for f/u HTN, tobacco dependence, gout, GERD, and anxiety.  I last saw him 10 mo ago and he was to try bupropion + OTC nicotine replacement for smoking cessation.  He has not completely quit but is down to 5 cigs/day.  Says home bp checks are consistently 120-130 over 70s-80s.     Has had one gout flare since I last saw him (left foot MTP jt) and he says colchicine took care of it quickly ( 1day).  He does not take prophylactic colchicine.  Takes xanax only on prn basis for anxiety.  Past Medical History  Diagnosis Date  . GERD (gastroesophageal reflux disease)   . Anxiety   . Tobacco dependence   . Palpable abdominal aorta 06/28/2011    Aortic u/s 07/03/11 showed NO AAA.    Past Surgical History  Procedure Date  . Skin cancer excision 05/2011    SSC from left side of face/neck    Outpatient Prescriptions Prior to Visit  Medication Sig Dispense Refill  . colchicine 0.6 MG tablet Take 1 tablet (0.6 mg total) by mouth 2 (two) times daily. Prn gout  60 tablet  1  . ALPRAZolam (XANAX) 0.25 MG tablet Take 0.25 mg by mouth as needed.        Marland Kitchen amLODipine-olmesartan (AZOR) 10-40 MG per tablet Take 1 tablet by mouth daily.  90 tablet  1  . esomeprazole (NEXIUM) 40 MG capsule Take 40 mg by mouth daily before breakfast.          Allergies  Allergen Reactions  . Codeine     ROS As per HPI  PE: Blood pressure 145/86, pulse 80, height 6\' 1"  (1.854 m), weight 170 lb (77.111 kg). Gen: Alert, well appearing.  Todd Garner is oriented to person, place, time, and situation. ENT:   Eyes: no injection, icteris, swelling, or exudate.  EOMI, PERRLA. Nose: no drainage or turbinate edema/swelling.  No injection or focal lesion.  Mouth: lips without lesion/swelling.  Oral mucosa pink and moist.  Dentition intact and  without obvious caries or gingival swelling.  Oropharynx without erythema, exudate, or swelling.  Neck: supple/nontender.  No LAD, mass, or TM.  Carotid pulses 2+ bilaterally, without bruits. CV: RRR, no m/r/g.   LUNGS: CTA bilat, nonlabored resps, good aeration in all lung fields. EXT: no clubbing, cyanosis, or edema.   LABS:  None today  IMPRESSION AND PLAN:  HTN (hypertension), benign Problem stable.  Continue current medications and diet appropriate for this condition.  We have reviewed our general long term plan for this problem and also reviewed symptoms and signs that should prompt the Todd Garner to call or return to the office. BMET today.  Tobacco dependence He's getting closer to quitting completely.  I encouraged him to continue to attempt to totally quit.  Colon cancer screening Discussed need for this again today. He wants to defer this again at this time, wait until things are less busy at work and more convenient for him. I mentioned the iFOB test as something we could do in the meantime and he declined this as well. Will take this up again at next f/u.  Gout Stable.  Colchicine prn works well for him at this point.    FOLLOW UP: Return in about 6  months (around 12/19/2012) for 30 min CPE appt, needs fasting lab work the week prior (ordered).. (last lipid panel 06/2011 was good). No vaccines due today.

## 2012-06-18 NOTE — Assessment & Plan Note (Signed)
Problem stable.  Continue current medications and diet appropriate for this condition.  We have reviewed our general long term plan for this problem and also reviewed symptoms and signs that should prompt the patient to call or return to the office. BMET today. 

## 2013-04-18 ENCOUNTER — Telehealth: Payer: Self-pay | Admitting: Emergency Medicine

## 2013-04-18 MED ORDER — AMLODIPINE-OLMESARTAN 10-40 MG PO TABS: 1.0000 | ORAL_TABLET | Freq: Every day | ORAL | Status: DC

## 2013-04-18 NOTE — Telephone Encounter (Signed)
Patient needs refill on blood pressure medication, has about five or six left [facility]

## 2013-04-18 NOTE — Telephone Encounter (Signed)
Rx request to pharmacy; **Office Visit Needed Prior to Future Refills**/SLS  

## 2013-07-22 ENCOUNTER — Encounter: Payer: Self-pay | Admitting: Family Medicine

## 2013-07-22 ENCOUNTER — Ambulatory Visit (INDEPENDENT_AMBULATORY_CARE_PROVIDER_SITE_OTHER): Payer: BC Managed Care – PPO | Admitting: Family Medicine

## 2013-07-22 ENCOUNTER — Telehealth: Payer: Self-pay | Admitting: Family Medicine

## 2013-07-22 VITALS — BP 154/84 | HR 75 | Temp 98.9°F | Resp 18 | Ht 73.0 in | Wt 170.0 lb

## 2013-07-22 DIAGNOSIS — F17200 Nicotine dependence, unspecified, uncomplicated: Secondary | ICD-10-CM

## 2013-07-22 DIAGNOSIS — J309 Allergic rhinitis, unspecified: Secondary | ICD-10-CM

## 2013-07-22 DIAGNOSIS — K219 Gastro-esophageal reflux disease without esophagitis: Secondary | ICD-10-CM

## 2013-07-22 DIAGNOSIS — I10 Essential (primary) hypertension: Secondary | ICD-10-CM

## 2013-07-22 DIAGNOSIS — Z1211 Encounter for screening for malignant neoplasm of colon: Secondary | ICD-10-CM

## 2013-07-22 DIAGNOSIS — F418 Other specified anxiety disorders: Secondary | ICD-10-CM | POA: Insufficient documentation

## 2013-07-22 DIAGNOSIS — F411 Generalized anxiety disorder: Secondary | ICD-10-CM

## 2013-07-22 DIAGNOSIS — M109 Gout, unspecified: Secondary | ICD-10-CM

## 2013-07-22 LAB — BASIC METABOLIC PANEL
CO2: 25 mEq/L (ref 19–32)
Chloride: 106 mEq/L (ref 96–112)
Creatinine, Ser: 1 mg/dL (ref 0.4–1.5)
Sodium: 139 mEq/L (ref 135–145)

## 2013-07-22 MED ORDER — AMLODIPINE-OLMESARTAN 10-40 MG PO TABS: 1.0000 | ORAL_TABLET | Freq: Every day | ORAL | Status: DC

## 2013-07-22 MED ORDER — ALPRAZOLAM 0.25 MG PO TABS: 0.2500 mg | ORAL_TABLET | ORAL | Status: DC | PRN

## 2013-07-22 MED ORDER — COLCHICINE 0.6 MG PO TABS: ORAL_TABLET | ORAL | Status: DC

## 2013-07-22 MED ORDER — FLUTICASONE PROPIONATE 50 MCG/ACT NA SUSP: NASAL | Status: DC

## 2013-07-22 MED ORDER — METOPROLOL SUCCINATE ER 25 MG PO TB24: 25.0000 mg | ORAL_TABLET | Freq: Every day | ORAL | Status: DC

## 2013-07-22 MED ORDER — ESOMEPRAZOLE MAGNESIUM 40 MG PO CPDR: 40.0000 mg | DELAYED_RELEASE_CAPSULE | Freq: Every day | ORAL | Status: DC

## 2013-07-22 NOTE — Assessment & Plan Note (Addendum)
He has required daily PPI for decades per his report. Remote history of normal EGD. I recommended he see GI MD for consideration of EGD to screen for Barrett's esophagus--he wants to review his work/vacation schedule with his wife before I make any referral.   Will get him back on 40mg  nexium daily.  He knows the diet adjustments needed but can't seem to make himself do them. Encouraged smoking cessation.

## 2013-07-22 NOTE — Assessment & Plan Note (Signed)
Continue prn xanax at current dosing.  Rx for #30, RF x 2 faxed to his mail order pharmacy today.

## 2013-07-22 NOTE — Assessment & Plan Note (Signed)
Flonase trial--rx sent to local pharmacy.

## 2013-07-22 NOTE — Assessment & Plan Note (Signed)
Quiescent. I did RF of colchicine b/c his is out of date.  He needs to have this on hand in case he begins to feel a flare.

## 2013-07-22 NOTE — Progress Notes (Signed)
OFFICE NOTE  07/22/2013  CC:  Chief Complaint  Patient presents with  . Hypertension  . Medication Refill     HPI: Patient is a 57 y.o. Caucasian male who is here for 1 yr HTN, gout, tob dependence, and GERD f/u. Overall says he is doing pretty good--finally had to come in for f/u b/c running out of bp meds and we required o/v before any further RFs.  Has been playing a lot of golf lately and working in yard a fair amount but no formal exercise regimen.  Home bp monitoring shows systolic consistently 140-150s over 70s-80s.   He is still smoking about 2 packs of cigs per week.  GERD acting up lately b/c he's out of 40mg  nexium and taking OTC 20mg  nexium.  Says he has been on daily acid suppressor of some type for "a couple of decades".  He hasn't consistently changed his diet enough to make meds unnecessary.   Has had only one gout flare since I saw him last, in the same toe as his usual flares.  He caught it very early (the first few hours of the pain) and took 2 colchicine and the pain went away and stayed away.    Has some nasal congestion from new pets and has been doing a lot of outside work lately---asks for med for this, says antihistamine makes him feel bad.  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 This has been working well.  ROS: no cp or sob, no fevers or loss of appetite or fatigue, no dysphagia or ST, no recent joint or muscle pains, mood is good.     Pertinent PMH:  Past Medical History  Diagnosis Date  . GERD (gastroesophageal reflux disease)   . Anxiety   . Tobacco dependence   . Palpable abdominal aorta 06/28/2011    Aortic u/s 07/03/11 showed NO AAA.   Past Surgical History  Procedure Laterality Date  . Skin cancer excision  05/2011    SSC from left side of face/neck  . Esophagogastroduodenoscopy  approx mid 1990s    pt reports normal.   History   Social History Narrative   Married, one  daughter who just graduated from [employer].   Has lived his entire life in [place], [place].   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   Ongoing tobacco dependence.    Drinks 3-4 beers several nights per week.  No hx of alc abuse or drug use.   No exercise.    MEDS:  Outpatient Prescriptions Prior to Visit  Medication Sig Dispense Refill  . amLODipine-olmesartan (AZOR) 10-40 MG per tablet Take 1 tablet by mouth daily.  90 tablet  0  . esomeprazole (NEXIUM) 40 MG capsule Take 1 capsule (40 mg total) by mouth daily before breakfast.  90 capsule  2  . ALPRAZolam (XANAX) 0.25 MG tablet Take 1 tablet (0.25 mg total) by mouth as needed.  30 tablet  2  . colchicine 0.6 MG tablet Take 1 tablet (0.6 mg total) by mouth 2 (two) times daily. Prn gout  60 tablet  1   No facility-administered medications prior to visit.    PE: Blood pressure 154/84, pulse 75, temperature 98.9 F (37.2 C), temperature source Temporal, resp. rate 18, height 6\' 1"  (1.854 m), weight 170 lb (77.111 kg), SpO2 100.00%. Gen: Alert, well appearing.  Patient is oriented to person, place, time, and situation.  IMPRESSION AND PLAN:  HTN (hypertension), benign Control not ideal, complicated by ongoing tobacco abuse. Continue azor 10/40 qd.  Would like to add HCTZ but he is hesitant about possibly having to urinate any more than he already does. Will do trial of toprol xl 25mg  qd.  Therapeutic expectations and side effect profile of medication discussed today.  Patient's questions answered. Continue to monitor bp and HR. BMET today (pt not fasting).  GERD (gastroesophageal reflux disease) He has required daily PPI for decades per his report. Remote history of normal EGD. I recommended he see GI MD for consideration of EGD to screen for Barrett's esophagus--he wants to review his work/vacation schedule with his wife before I make any referral.   Will get him back on 40mg  nexium daily.  He knows  the diet adjustments needed but can't seem to make himself do them. Encouraged smoking cessation.  Tobacco dependence He has been successful at cutting back but has not reached the point where he is contemplating complete cessation. I tried to encourage him today to try.  He is afraid of lots of withdrawal symptoms but I told him I think these would be something he could handle given the amount he currently smokes.  Colon cancer screening Reviewed need for this once again today. He is ok with the idea of doing this but is a bit noncommittal about actually making the step to a GI referral and getting things scheduled. Once again, he'll consult his work/vacation schedule and call us back with a decision about whether he wants to proceed with this and if so, when. He may choose to try to get the upper endoscopic eval for chronic GERD addressed at the same time as the colon cancer screening.  Gout Quiescent. I did RF of colchicine b/c his is out of date.  He needs to have this on hand in case he begins to feel a flare.  Allergic rhinitis Flonase trial--rx sent to local pharmacy.  Anxiety state, unspecified Continue prn xanax at current dosing.  Rx for #30, RF x 2 faxed to his mail order pharmacy today.   An After Visit Summary was printed and given to the patient.  FOLLOW UP: 3 mo f/u HTN

## 2013-07-22 NOTE — Assessment & Plan Note (Signed)
He has been successful at cutting back but has not reached the point where he is contemplating complete cessation. I tried to encourage him today to try.  He is afraid of lots of withdrawal symptoms but I told him I think these would be something he could handle given the amount he currently smokes.

## 2013-07-22 NOTE — Assessment & Plan Note (Signed)
Control not ideal, complicated by ongoing tobacco abuse. Continue azor 10/40 qd.  Would like to add HCTZ but he is hesitant about possibly having to urinate any more than he already does. Will do trial of toprol xl 25mg  qd.  Therapeutic expectations and side effect profile of medication discussed today.  Patient's questions answered. Continue to monitor bp and HR. BMET today (pt not fasting).

## 2013-07-22 NOTE — Telephone Encounter (Signed)
Patient aware.

## 2013-07-22 NOTE — Assessment & Plan Note (Signed)
Reviewed need for this once again today. He is ok with the idea of doing this but is a bit noncommittal about actually making the step to a GI referral and getting things scheduled. Once again, he'll consult his work/vacation schedule and call us back with a decision about whether he wants to proceed with this and if so, when. He may choose to try to get the upper endoscopic eval for chronic GERD addressed at the same time as the colon cancer screening.

## 2013-07-22 NOTE — Telephone Encounter (Signed)
Pls give pt a call and tell him I forgot to give him rx for the nasal spray for his nasal congestion like we talked about. I went ahead and sent rx for flonase to his local pharmacy.  Tell him this med works well only if taken on a daily basis (can't just take it on an "as needed basis" for stuffy nose b/c it takes a few days to begin to work well).-thx

## 2013-07-30 ENCOUNTER — Other Ambulatory Visit: Payer: Self-pay | Admitting: Family Medicine

## 2013-10-22 ENCOUNTER — Ambulatory Visit: Payer: BC Managed Care – PPO | Admitting: Family Medicine

## 2013-10-30 ENCOUNTER — Ambulatory Visit (INDEPENDENT_AMBULATORY_CARE_PROVIDER_SITE_OTHER): Payer: BC Managed Care – PPO | Admitting: Family Medicine

## 2013-10-30 ENCOUNTER — Encounter: Payer: Self-pay | Admitting: Family Medicine

## 2013-10-30 VITALS — BP 163/79 | HR 76 | Temp 98.1°F | Resp 18 | Ht 73.0 in | Wt 174.0 lb

## 2013-10-30 DIAGNOSIS — I10 Essential (primary) hypertension: Secondary | ICD-10-CM

## 2013-10-30 DIAGNOSIS — Z23 Encounter for immunization: Secondary | ICD-10-CM

## 2013-10-30 NOTE — Assessment & Plan Note (Signed)
Home monitoring is fine. Continue azor 10/40 qd. Start toprol XL previously rx'd IF avg systolic gets into the 150s or diastolics into the 90s. Continue 81mg  ASA qd. Encouraged tobacco cessation.

## 2013-10-30 NOTE — Addendum Note (Signed)
Addended by: Eulah Pont on: 10/30/2013 01:49 PM   Modules accepted: Orders

## 2013-10-30 NOTE — Progress Notes (Signed)
OFFICE NOTE  10/30/2013  CC:  Chief Complaint  Patient presents with  . Follow-up     HPI: Patient is a 57 y.o. Caucasian male who is here for 3 mo f/u HTN.   Never started metoprolol, monitored bp daily for a while and avg 140s/80s. Was scared of potential side effects of metop. He takes a bayer ASA 81mg  most days of the week.  ROS: no HA's, no CP, no sOB.   Pertinent PMH:  Past Medical History  Diagnosis Date  . GERD (gastroesophageal reflux disease)   . Anxiety   . Tobacco dependence   . Palpable abdominal aorta 06/28/2011    Aortic u/s 07/03/11 showed NO AAA.  Marland Kitchen HTN (hypertension)    Past surgical, social, and family history reviewed and no changes noted since last office visit.  MEDS:  Outpatient Prescriptions Prior to Visit  Medication Sig Dispense Refill  . ALPRAZolam (XANAX) 0.25 MG tablet Take 1 tablet (0.25 mg total) by mouth as needed.  30 tablet  2  . amLODipine-olmesartan (AZOR) 10-40 MG per tablet Take 1 tablet by mouth daily.  90 tablet  4  . colchicine 0.6 MG tablet 2 tabs po at onset of gout flare, then 1 tab 2 hours later.  Then 1 tab po bid beginning the following day until no further pain  30 tablet  1  . esomeprazole (NEXIUM) 40 MG capsule Take 1 capsule (40 mg total) by mouth daily before breakfast.  90 capsule  4  . fluticasone (FLONASE) 50 MCG/ACT nasal spray 2 sprays in each nostril once daily for nasal allergy symptoms  16 g  11  . metoprolol succinate (TOPROL-XL) 25 MG 24 hr tablet Take 1 tablet (25 mg total) by mouth daily.  30 tablet  1   No facility-administered medications prior to visit.    PE: Blood pressure 163/79, pulse 76, temperature 98.1 F (36.7 C), temperature source Temporal, resp. rate 18, height 6\' 1"  (1.854 m), weight 174 lb (78.926 kg), SpO2 98.00%. Gen: Alert, well appearing.  Patient is oriented to person, place, time, and situation. CV: RRR, no m/r/g.   LUNGS: CTA bilat, nonlabored resps, good aeration in all lung  fields. EXT: no clubbing, cyanosis, or edema.   IMPRESSION AND PLAN:  HTN (hypertension), benign Home monitoring is fine. Continue azor 10/40 qd. Start toprol XL previously rx'd IF avg systolic gets into the 150s or diastolics into the 90s. Continue 81mg  ASA qd. Encouraged tobacco cessation.   Flu vaccine IM today.  FOLLOW UP: 4mo for CPE with fasting labs

## 2014-04-28 ENCOUNTER — Ambulatory Visit (INDEPENDENT_AMBULATORY_CARE_PROVIDER_SITE_OTHER): Payer: BC Managed Care – PPO | Admitting: Family Medicine

## 2014-04-28 ENCOUNTER — Telehealth: Payer: Self-pay | Admitting: Family Medicine

## 2014-04-28 ENCOUNTER — Encounter: Payer: Self-pay | Admitting: Family Medicine

## 2014-04-28 VITALS — BP 130/83 | HR 76 | Temp 98.7°F | Resp 18 | Ht 73.0 in | Wt 172.0 lb

## 2014-04-28 DIAGNOSIS — Z0000 Encounter for general adult medical examination without abnormal findings: Secondary | ICD-10-CM

## 2014-04-28 DIAGNOSIS — Z125 Encounter for screening for malignant neoplasm of prostate: Secondary | ICD-10-CM

## 2014-04-28 LAB — CBC WITH DIFFERENTIAL/PLATELET
BASOS ABS: 0 10*3/uL (ref 0.0–0.1)
Basophils Relative: 0.3 % (ref 0.0–3.0)
EOS PCT: 1.4 % (ref 0.0–5.0)
Eosinophils Absolute: 0.2 10*3/uL (ref 0.0–0.7)
HCT: 49.1 % (ref 39.0–52.0)
Hemoglobin: 16.7 g/dL (ref 13.0–17.0)
LYMPHS PCT: 11.9 % — AB (ref 12.0–46.0)
Lymphs Abs: 1.6 10*3/uL (ref 0.7–4.0)
MCHC: 33.9 g/dL (ref 30.0–36.0)
MCV: 90.9 fl (ref 78.0–100.0)
MONOS PCT: 6 % (ref 3.0–12.0)
Monocytes Absolute: 0.8 10*3/uL (ref 0.1–1.0)
NEUTROS PCT: 80.4 % — AB (ref 43.0–77.0)
Neutro Abs: 10.6 10*3/uL — ABNORMAL HIGH (ref 1.4–7.7)
PLATELETS: 365 10*3/uL (ref 150.0–400.0)
RBC: 5.4 Mil/uL (ref 4.22–5.81)
RDW: 13.6 % (ref 11.5–15.5)
WBC: 13.2 10*3/uL — AB (ref 4.0–10.5)

## 2014-04-28 LAB — LIPID PANEL
CHOL/HDL RATIO: 4
Cholesterol: 220 mg/dL — ABNORMAL HIGH (ref 0–200)
HDL: 50.2 mg/dL (ref >39.00)
LDL CALC: 140 mg/dL — AB (ref 0–99)
TRIGLYCERIDES: 149 mg/dL (ref 0.0–149.0)
VLDL: 29.8 mg/dL (ref 0.0–40.0)

## 2014-04-28 LAB — COMPREHENSIVE METABOLIC PANEL
ALBUMIN: 4.4 g/dL (ref 3.5–5.2)
ALT: 22 U/L (ref 0–53)
AST: 25 U/L (ref 0–37)
Alkaline Phosphatase: 95 U/L (ref 39–117)
BILIRUBIN TOTAL: 0.9 mg/dL (ref 0.2–1.2)
BUN: 12 mg/dL (ref 6–23)
CHLORIDE: 105 meq/L (ref 96–112)
CO2: 27 meq/L (ref 19–32)
Calcium: 9.6 mg/dL (ref 8.4–10.5)
Creatinine, Ser: 1 mg/dL (ref 0.4–1.5)
GFR: 81.13 mL/min (ref >60.00)
GLUCOSE: 94 mg/dL (ref 70–99)
POTASSIUM: 4.4 meq/L (ref 3.5–5.1)
SODIUM: 140 meq/L (ref 135–145)
TOTAL PROTEIN: 7.2 g/dL (ref 6.0–8.3)

## 2014-04-28 LAB — PSA: PSA: 1.6 ng/mL (ref 0.10–4.00)

## 2014-04-28 LAB — TSH: TSH: 0.63 u[IU]/mL (ref 0.35–4.50)

## 2014-04-28 NOTE — Progress Notes (Signed)
Pre visit review using our clinic review tool, if applicable. No additional management support is needed unless otherwise documented below in the visit note. 

## 2014-04-28 NOTE — Telephone Encounter (Signed)
Relevant patient education mailed to patient.  

## 2014-04-28 NOTE — Progress Notes (Signed)
Office Note 04/28/2014  CC:  Chief Complaint  Patient presents with  . Annual Exam    HPI:  Todd Garner is a 58 y.o. White male who is here for annual CPE. Feeling well, no acute complaints. Still smoking but has cut back to 3 packs per week. Home bp monitoring 130s/80s consistently.  Not requiring any metoprolol.    Past Medical History  Diagnosis Date  . GERD (gastroesophageal reflux disease)   . Anxiety   . Tobacco dependence   . Palpable abdominal aorta 06/28/2011    Aortic u/s 07/03/11 showed NO AAA.  Marland Kitchen HTN (hypertension)   . Allergic rhinitis   . Anal condylomata     Past Surgical History  Procedure Laterality Date  . Skin cancer excision  05/2011    SSC from left side of face/neck  . Esophagogastroduodenoscopy  approx mid 1990s    pt reports normal.  . Anal wart excision      Family History  Problem Relation Age of Onset  . Hypertension Mother   . Cancer Father 61    lung cancer  . Hypertension Father   . Hypertension Paternal Grandfather     History   Social History  . Marital Status: Married    Spouse Name: Tammy    Number of Children: 1  . Years of Education: N/A   Occupational History  . Not on file.   Social History Main Topics  . Smoking status: Current Every Day Smoker -- 0.50 packs/day    Types: Cigarettes  . Smokeless tobacco: Never Used  . Alcohol Use: Yes     Comment: beer 2-3 nights week  . Drug Use: No  . Sexual Activity: Not on file   Other Topics Concern  . Not on file   Social History Narrative   Married, one daughter who just graduated from [employer].   Has lived his entire life in [place], [place].   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   Ongoing tobacco dependence.    Drinks 3-4 beers several nights per week.  No hx of alc abuse or drug use.   No exercise.    Outpatient Prescriptions Prior to Visit  Medication Sig Dispense Refill  . ALPRAZolam (XANAX) 0.25 MG tablet Take 1 tablet  (0.25 mg total) by mouth as needed.  30 tablet  2  . amLODipine-olmesartan (AZOR) 10-40 MG per tablet Take 1 tablet by mouth daily.  90 tablet  4  . colchicine 0.6 MG tablet 2 tabs po at onset of gout flare, then 1 tab 2 hours later.  Then 1 tab po bid beginning the following day until no further pain  30 tablet  1  . esomeprazole (NEXIUM) 40 MG capsule Take 1 capsule (40 mg total) by mouth daily before breakfast.  90 capsule  4  . fluticasone (FLONASE) 50 MCG/ACT nasal spray 2 sprays in each nostril once daily for nasal allergy symptoms  16 g  11  . metoprolol succinate (TOPROL-XL) 25 MG 24 hr tablet Take 1 tablet (25 mg total) by mouth daily.  30 tablet  1   No facility-administered medications prior to visit.    Allergies  Allergen Reactions  . Codeine     ROS Review of Systems  Constitutional: Negative for fever, chills, appetite change and fatigue.  HENT: Negative for congestion, dental problem, ear pain and sore throat.   Eyes: Negative for discharge, redness and visual disturbance.  Respiratory: Negative for cough,  chest tightness, shortness of breath and wheezing.   Cardiovascular: Negative for chest pain, palpitations and leg swelling.  Gastrointestinal: Negative for nausea, vomiting, abdominal pain, diarrhea and blood in stool.  Genitourinary: Negative for dysuria, urgency, frequency, hematuria, flank pain and difficulty urinating.  Musculoskeletal: Negative for arthralgias, back pain, joint swelling, myalgias and neck stiffness.  Skin: Negative for pallor and rash.  Neurological: Negative for dizziness, speech difficulty, weakness and headaches.  Hematological: Negative for adenopathy. Does not bruise/bleed easily.  Psychiatric/Behavioral: Negative for confusion and sleep disturbance. The patient is not nervous/anxious.     PE; Blood pressure 130/83, pulse 76, temperature 98.7 F (37.1 C), temperature source Temporal, resp. rate 18, height 6\' 1"  (1.854 m), weight 172 lb  (78.019 kg), SpO2 97.00%. Gen: Alert, well appearing.  Patient is oriented to person, place, time, and situation. AFFECT: pleasant, lucid thought and speech. ENT: Ears: EACs clear, normal epithelium.  TMs with good light reflex and landmarks bilaterally.  Eyes: no injection, icteris, swelling, or exudate.  EOMI, PERRLA. Nose: no drainage or turbinate edema/swelling.  No injection or focal lesion.  Mouth: lips without lesion/swelling.  Oral mucosa pink and moist.  Dentition intact and without obvious caries or gingival swelling.  Oropharynx without erythema, exudate, or swelling.  Neck: supple/nontender.  No LAD, mass, or TM.  Carotid pulses 2+ bilaterally, without bruits. CV: RRR, no m/r/g.   LUNGS: CTA bilat, nonlabored resps, good aeration in all lung fields. ABD: soft, NT, ND, BS normal.  No hepatospenomegaly or mass.  No bruits. EXT: no clubbing, cyanosis, or edema.  Musculoskeletal: no joint swelling, erythema, warmth, or tenderness.  ROM of all joints intact. Skin - no sores or suspicious lesions or rashes or color changes Rectal exam: nontender.  No internal mass.  Around anal verge there are a few sub-centimeter sized condylomata.  PROSTATE EXAM: smooth and symmetric without nodules or tenderness.  Pertinent labs:  None today  ASSESSMENT AND PLAN:   Health maintenance examination Reviewed age and gender appropriate health maintenance issues (prudent diet, regular exercise, health risks of tobacco and excessive alcohol, use of seatbelts, fire alarms in home, use of sunscreen).  Also reviewed age and gender appropriate health screening as well as vaccine recommendations. HP labs + PSA today. DRE normal today. He wants to continue to defer colon cancer screening for the time being.  Says he wants to go ahead with ordering GI referral for this when he follows up with me in 6 mo. Encouraged tobacco cessation today.   HTN: stable.  Will take metoprolol off his med list, as he has not  needed to start this med.   An After Visit Summary was printed and given to the patient.  FOLLOW UP:  Return in about 6 months (around 10/29/2014) for routine chronic illness f/u.

## 2014-04-28 NOTE — Assessment & Plan Note (Signed)
Reviewed age and gender appropriate health maintenance issues (prudent diet, regular exercise, health risks of tobacco and excessive alcohol, use of seatbelts, fire alarms in home, use of sunscreen).  Also reviewed age and gender appropriate health screening as well as vaccine recommendations. HP labs + PSA today. DRE normal today. He wants to continue to defer colon cancer screening for the time being.  Says he wants to go ahead with ordering GI referral for this when he follows up with me in 6 mo. Encouraged tobacco cessation today.

## 2014-07-23 ENCOUNTER — Other Ambulatory Visit: Payer: Self-pay | Admitting: Family Medicine

## 2014-07-26 ENCOUNTER — Other Ambulatory Visit: Payer: Self-pay | Admitting: Family Medicine

## 2014-11-06 ENCOUNTER — Encounter: Payer: Self-pay | Admitting: Family Medicine

## 2014-11-06 ENCOUNTER — Ambulatory Visit (INDEPENDENT_AMBULATORY_CARE_PROVIDER_SITE_OTHER): Payer: BC Managed Care – PPO | Admitting: Family Medicine

## 2014-11-06 VITALS — BP 158/84 | HR 78 | Temp 98.5°F | Resp 18 | Ht 73.0 in | Wt 170.0 lb

## 2014-11-06 DIAGNOSIS — I10 Essential (primary) hypertension: Secondary | ICD-10-CM

## 2014-11-06 DIAGNOSIS — D72829 Elevated white blood cell count, unspecified: Secondary | ICD-10-CM

## 2014-11-06 DIAGNOSIS — M1000 Idiopathic gout, unspecified site: Secondary | ICD-10-CM

## 2014-11-06 DIAGNOSIS — Z23 Encounter for immunization: Secondary | ICD-10-CM

## 2014-11-06 DIAGNOSIS — K219 Gastro-esophageal reflux disease without esophagitis: Secondary | ICD-10-CM

## 2014-11-06 DIAGNOSIS — J302 Other seasonal allergic rhinitis: Secondary | ICD-10-CM

## 2014-11-06 DIAGNOSIS — F418 Other specified anxiety disorders: Secondary | ICD-10-CM

## 2014-11-06 DIAGNOSIS — F17200 Nicotine dependence, unspecified, uncomplicated: Secondary | ICD-10-CM

## 2014-11-06 DIAGNOSIS — E785 Hyperlipidemia, unspecified: Secondary | ICD-10-CM

## 2014-11-06 MED ORDER — ALPRAZOLAM 0.25 MG PO TABS: 0.2500 mg | ORAL_TABLET | ORAL | Status: DC | PRN

## 2014-11-06 MED ORDER — FLUTICASONE PROPIONATE 50 MCG/ACT NA SUSP: NASAL | Status: DC

## 2014-11-06 NOTE — Progress Notes (Signed)
Pre visit review using our clinic review tool, if applicable. No additional management support is needed unless otherwise documented below in the visit note. 

## 2014-11-06 NOTE — Progress Notes (Signed)
OFFICE NOTE  11/06/2014  CC:  Chief Complaint  Patient presents with  . Follow-up  . Medication Refill    Flonase   HPI: Patient is a 58 y.o. Caucasian male who is here for 6 mo f/u HTN, gout, GERD, anxiety.  BP: home measurements 130s/80s.  Compliant with med daily.  GERD: takes his nexium only a couple days a week.  Flonase: uses consistently and it works.  Xanax 0.25mg : usually takes this once a day and he says this helps a lot and he needs RF. 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  Hopes to start exercising soon as well.  Gout: acted up a couple times since I last saw him, took colchicine right away and it went away quickly, although it did cause some diarrhea.   Pertinent PMH:  Past medical, surgical, social, and family history reviewed and no changes are noted since last office visit.  MEDS:  Outpatient Prescriptions Prior to Visit  Medication Sig Dispense Refill  . ALPRAZolam (XANAX) 0.25 MG tablet Take 1 tablet (0.25 mg total) by mouth as needed. 30 tablet 2  . AZOR 10-40 MG per tablet TAKE 1 TABLET BY MOUTH DAILY 90 tablet 1  . colchicine 0.6 MG tablet 2 tabs po at onset of gout flare, then 1 tab 2 hours later.  Then 1 tab po bid beginning the following day until no further pain 30 tablet 1  . esomeprazole (NEXIUM) 40 MG capsule Take 1 capsule (40 mg total) by mouth daily before breakfast. 90 capsule 4  . fluticasone (FLONASE) 50 MCG/ACT nasal spray 2 sprays in each nostril once daily for nasal allergy symptoms 16 g 11   No facility-administered medications prior to visit.    PE: Blood pressure 158/84, pulse 78, temperature 98.5 F (36.9 C), temperature source Temporal, resp. rate 18, height 6\' 1"  (1.854 m), weight 170 lb (77.111 kg), SpO2 97 %. Gen: Alert, well appearing.  Patient is oriented to person, place, time, and situation. CV: RRR, no m/r/g.   LUNGS:  CTA bilat, nonlabored resps, good aeration in all lung fields. EXT: no clubbing, cyanosis, or edema.    IMPRESSION AND PLAN:  1) HTN; The current medical regimen is effective;  continue present plan and medications.  2) Gout; The current medical regimen is effective;  continue present plan and medications.  3) GERD: contin prn nexium, GER diet.  4) Seas allerg rhin: RF flonase.  5) Situational anxiety; doing fine on low dose, relatively infrequent use of xanax.  RF'd rx today.  6) Tob dependence: continues to cut back slowly.  7) Prev health: flu vaccine IM today.  FOLLOW UP: 6 mo for fasting CPE. Return next week when fasting to repeat CBC (hx of leukocytosis) and FLP (hx of hyperlipidemia, tried diet the last 6 mo).

## 2014-11-09 ENCOUNTER — Telehealth: Payer: Self-pay | Admitting: Family Medicine

## 2014-11-09 NOTE — Telephone Encounter (Signed)
emmi emailed °

## 2014-11-10 ENCOUNTER — Telehealth: Payer: Self-pay | Admitting: Family Medicine

## 2014-11-10 NOTE — Telephone Encounter (Signed)
emmi emailed °

## 2014-11-11 ENCOUNTER — Other Ambulatory Visit: Payer: BC Managed Care – PPO

## 2014-11-20 ENCOUNTER — Other Ambulatory Visit: Payer: BC Managed Care – PPO

## 2014-11-21 LAB — CBC WITH DIFFERENTIAL/PLATELET
Basophils Absolute: 0.1 10*3/uL (ref 0.0–0.1)
Basophils Relative: 1 % (ref 0–1)
EOS PCT: 4 % (ref 0–5)
Eosinophils Absolute: 0.2 10*3/uL (ref 0.0–0.7)
HCT: 46.6 % (ref 39.0–52.0)
HEMOGLOBIN: 15.8 g/dL (ref 13.0–17.0)
LYMPHS ABS: 1.3 10*3/uL (ref 0.7–4.0)
LYMPHS PCT: 21 % (ref 12–46)
MCH: 29.9 pg (ref 26.0–34.0)
MCHC: 33.9 g/dL (ref 30.0–36.0)
MCV: 88.3 fL (ref 78.0–100.0)
MONOS PCT: 10 % (ref 3–12)
MPV: 9.3 fL — ABNORMAL LOW (ref 9.4–12.4)
Monocytes Absolute: 0.6 10*3/uL (ref 0.1–1.0)
Neutro Abs: 4 10*3/uL (ref 1.7–7.7)
Neutrophils Relative %: 64 % (ref 43–77)
Platelets: 336 10*3/uL (ref 150–400)
RBC: 5.28 MIL/uL (ref 4.22–5.81)
RDW: 13.4 % (ref 11.5–15.5)
WBC: 6.2 10*3/uL (ref 4.0–10.5)

## 2014-11-21 LAB — LIPID PANEL
CHOLESTEROL: 194 mg/dL (ref 0–200)
HDL: 46 mg/dL (ref >39)
LDL Cholesterol: 107 mg/dL — ABNORMAL HIGH (ref 0–99)
Total CHOL/HDL Ratio: 4.2 Ratio
Triglycerides: 205 mg/dL — ABNORMAL HIGH (ref <150)
VLDL: 41 mg/dL — ABNORMAL HIGH (ref 0–40)

## 2014-12-18 DIAGNOSIS — R7301 Impaired fasting glucose: Secondary | ICD-10-CM

## 2014-12-18 HISTORY — DX: Impaired fasting glucose: R73.01

## 2014-12-28 ENCOUNTER — Other Ambulatory Visit: Payer: Self-pay

## 2014-12-28 MED ORDER — ESOMEPRAZOLE MAGNESIUM 40 MG PO CPDR: 40.0000 mg | DELAYED_RELEASE_CAPSULE | Freq: Every day | ORAL | Status: DC

## 2014-12-30 ENCOUNTER — Other Ambulatory Visit: Payer: Self-pay | Admitting: Family Medicine

## 2014-12-30 MED ORDER — ESOMEPRAZOLE MAGNESIUM 40 MG PO CPDR: 40.0000 mg | DELAYED_RELEASE_CAPSULE | Freq: Every day | ORAL | Status: DC

## 2015-01-14 ENCOUNTER — Ambulatory Visit (INDEPENDENT_AMBULATORY_CARE_PROVIDER_SITE_OTHER): Payer: BLUE CROSS/BLUE SHIELD | Admitting: Family Medicine

## 2015-01-14 ENCOUNTER — Encounter: Payer: Self-pay | Admitting: Family Medicine

## 2015-01-14 VITALS — BP 148/79 | HR 71 | Temp 97.7°F | Ht 73.0 in | Wt 170.0 lb

## 2015-01-14 DIAGNOSIS — J069 Acute upper respiratory infection, unspecified: Secondary | ICD-10-CM

## 2015-01-14 NOTE — Progress Notes (Signed)
Pre visit review using our clinic review tool, if applicable. No additional management support is needed unless otherwise documented below in the visit note. 

## 2015-01-14 NOTE — Progress Notes (Signed)
OFFICE NOTE  01/14/2015  CC:  Chief Complaint  Patient presents with  . Sinusitis   HPI: Patient is a 59 y.o. Caucasian male who is here for "sinus pressure". Onset a week ago of ST, lasted 2d then resolved, also got nasal congestion and pressure under eyes/around nose, mucous in nose.  No cough. No fever.  Felt more "sick" yesterday but today better.  OTC med: advil cold/sinus.  Miucous green last 24h so he got worried. No wheezing or SOB. +Smoker.  Pertinent PMH:  Past medical, surgical, social, and family history reviewed and no changes are noted since last office visit.  MEDS:  Outpatient Prescriptions Prior to Visit  Medication Sig Dispense Refill  . ALPRAZolam (XANAX) 0.25 MG tablet Take 1 tablet (0.25 mg total) by mouth as needed. 30 tablet 5  . AZOR 10-40 MG per tablet TAKE 1 TABLET BY MOUTH DAILY 90 tablet 1  . colchicine 0.6 MG tablet 2 tabs po at onset of gout flare, then 1 tab 2 hours later.  Then 1 tab po bid beginning the following day until no further pain 30 tablet 1  . esomeprazole (NEXIUM) 40 MG capsule Take 1 capsule (40 mg total) by mouth daily before breakfast. 90 capsule 3  . fluticasone (FLONASE) 50 MCG/ACT nasal spray 2 sprays in each nostril once daily for nasal allergy symptoms 16 g 11   No facility-administered medications prior to visit.    PE: Blood pressure 148/79, pulse 71, temperature 97.7 F (36.5 C), temperature source Temporal, height 6\' 1"  (1.854 m), weight 170 lb (77.111 kg), SpO2 99 %. VS: noted--normal. Gen: alert, NAD, NONTOXIC APPEARING. HEENT: eyes without injection, drainage, or swelling.  Ears: EACs clear, TMs with normal light reflex and landmarks.  Nose: Clear rhinorrhea, with some dried, crusty exudate adherent to mildly injected mucosa.  No purulent d/c.  No paranasal sinus TTP.  No facial swelling.  Throat and mouth without focal lesion.  No pharyngial swelling, erythema, or exudate.   Neck: supple, no LAD.   LUNGS: CTA bilat,  nonlabored resps.   CV: RRR, no m/r/g. EXT: no c/c/e SKIN: no rash  LAB: none  IMPRESSION AND PLAN:  Viral URI, symptomatic care discussed. Trial of mucinex DM or robitussin DM otc as directed on the box. May use OTC nasal saline spray or irrigation solution bid. OTC nonsedating antihistamines prn discussed.  Decongestant use discussed--ok if tolerated in the past w/out side effect and if pt has no hx of HTN. Signs/symptoms to call or return for were reviewed and pt expressed understanding.  An After Visit Summary was printed and given to the patient.  FOLLOW UP: prn

## 2015-01-22 ENCOUNTER — Other Ambulatory Visit: Payer: Self-pay | Admitting: Family Medicine

## 2015-01-22 MED ORDER — AMLODIPINE-OLMESARTAN 10-40 MG PO TABS: 1.0000 | ORAL_TABLET | Freq: Every day | ORAL | Status: DC

## 2015-04-29 ENCOUNTER — Other Ambulatory Visit (INDEPENDENT_AMBULATORY_CARE_PROVIDER_SITE_OTHER): Payer: BLUE CROSS/BLUE SHIELD

## 2015-04-29 DIAGNOSIS — Z0000 Encounter for general adult medical examination without abnormal findings: Secondary | ICD-10-CM

## 2015-04-29 DIAGNOSIS — Z125 Encounter for screening for malignant neoplasm of prostate: Secondary | ICD-10-CM

## 2015-04-29 LAB — CBC WITH DIFFERENTIAL/PLATELET
BASOS ABS: 0.1 10*3/uL (ref 0.0–0.1)
BASOS PCT: 1.2 % (ref 0.0–3.0)
EOS ABS: 0.2 10*3/uL (ref 0.0–0.7)
Eosinophils Relative: 3 % (ref 0.0–5.0)
HEMATOCRIT: 43 % (ref 39.0–52.0)
HEMOGLOBIN: 14.6 g/dL (ref 13.0–17.0)
LYMPHS ABS: 2.1 10*3/uL (ref 0.7–4.0)
Lymphocytes Relative: 42.1 % (ref 12.0–46.0)
MCHC: 34.1 g/dL (ref 30.0–36.0)
MCV: 98 fl (ref 78.0–100.0)
MONO ABS: 0.7 10*3/uL (ref 0.1–1.0)
Monocytes Relative: 13.6 % — ABNORMAL HIGH (ref 3.0–12.0)
NEUTROS ABS: 2 10*3/uL (ref 1.4–7.7)
Neutrophils Relative %: 40.1 % — ABNORMAL LOW (ref 43.0–77.0)
Platelets: 277 10*3/uL (ref 150.0–400.0)
RBC: 4.39 Mil/uL (ref 4.22–5.81)
RDW: 14.3 % (ref 11.5–15.5)
WBC: 5.1 10*3/uL (ref 4.0–10.5)

## 2015-04-29 LAB — LIPID PANEL
CHOLESTEROL: 159 mg/dL (ref 0–200)
HDL: 69.8 mg/dL (ref >39.00)
LDL Cholesterol: 82 mg/dL (ref 0–99)
NonHDL: 89.2
Total CHOL/HDL Ratio: 2
Triglycerides: 38 mg/dL (ref 0.0–149.0)
VLDL: 7.6 mg/dL (ref 0.0–40.0)

## 2015-04-29 LAB — COMPREHENSIVE METABOLIC PANEL
ALBUMIN: 3.9 g/dL (ref 3.5–5.2)
ALT: 22 U/L (ref 0–53)
AST: 28 U/L (ref 0–37)
Alkaline Phosphatase: 30 U/L — ABNORMAL LOW (ref 39–117)
BILIRUBIN TOTAL: 1.5 mg/dL — AB (ref 0.2–1.2)
BUN: 22 mg/dL (ref 6–23)
CALCIUM: 9.1 mg/dL (ref 8.4–10.5)
CO2: 28 meq/L (ref 19–32)
CREATININE: 0.94 mg/dL (ref 0.40–1.50)
Chloride: 105 mEq/L (ref 96–112)
GFR: 87.82 mL/min (ref >60.00)
Glucose, Bld: 109 mg/dL — ABNORMAL HIGH (ref 70–99)
Potassium: 4.8 mEq/L (ref 3.5–5.1)
SODIUM: 137 meq/L (ref 135–145)
Total Protein: 7 g/dL (ref 6.0–8.3)

## 2015-04-29 LAB — TSH: TSH: 0.82 u[IU]/mL (ref 0.35–4.50)

## 2015-04-29 LAB — PSA: PSA: 0.5 ng/mL (ref 0.10–4.00)

## 2015-05-07 ENCOUNTER — Encounter: Payer: BC Managed Care – PPO | Admitting: Family Medicine

## 2015-05-10 ENCOUNTER — Encounter: Payer: BLUE CROSS/BLUE SHIELD | Admitting: Family Medicine

## 2015-05-14 ENCOUNTER — Encounter: Payer: BLUE CROSS/BLUE SHIELD | Admitting: Family Medicine

## 2015-06-01 ENCOUNTER — Encounter: Payer: Self-pay | Admitting: Family Medicine

## 2015-06-01 ENCOUNTER — Ambulatory Visit (INDEPENDENT_AMBULATORY_CARE_PROVIDER_SITE_OTHER): Payer: BLUE CROSS/BLUE SHIELD | Admitting: Family Medicine

## 2015-06-01 VITALS — BP 133/74 | HR 66 | Temp 97.6°F | Resp 16 | Ht 71.0 in | Wt 168.0 lb

## 2015-06-01 DIAGNOSIS — Z0000 Encounter for general adult medical examination without abnormal findings: Secondary | ICD-10-CM

## 2015-06-01 DIAGNOSIS — R7301 Impaired fasting glucose: Secondary | ICD-10-CM

## 2015-06-01 LAB — BASIC METABOLIC PANEL
BUN: 16 mg/dL (ref 6–23)
CALCIUM: 9.3 mg/dL (ref 8.4–10.5)
CHLORIDE: 104 meq/L (ref 96–112)
CO2: 30 mEq/L (ref 19–32)
Creatinine, Ser: 0.97 mg/dL (ref 0.40–1.50)
GFR: 84.67 mL/min (ref >60.00)
Glucose, Bld: 105 mg/dL — ABNORMAL HIGH (ref 70–99)
POTASSIUM: 4.6 meq/L (ref 3.5–5.1)
SODIUM: 138 meq/L (ref 135–145)

## 2015-06-01 LAB — HEMOGLOBIN A1C: Hgb A1c MFr Bld: 5.2 % (ref 4.6–6.5)

## 2015-06-01 MED ORDER — ALPRAZOLAM 0.25 MG PO TABS: 0.2500 mg | ORAL_TABLET | ORAL | Status: DC | PRN

## 2015-06-01 NOTE — Progress Notes (Signed)
Pre visit review using our clinic review tool, if applicable. No additional management support is needed unless otherwise documented below in the visit note. 

## 2015-06-01 NOTE — Progress Notes (Signed)
Office Note 06/01/2015  CC:  Chief Complaint  Patient presents with  . Annual Exam    Pt is fasting.    HPI:  Todd Garner is a 59 y.o. White male who is here for annual health maintenance exam. Wants to defer colonoscopy for now, has a busy summer, says he'll call back later to request GI referral when he is ready. Reviewed recent labs from 04/2015 with pt: all normal except glucose 108.  He is fasting today in prep for repeat fasting glucose, plus we'll do Hba1c today.  No acute complaints.   Past Medical History  Diagnosis Date  . GERD (gastroesophageal reflux disease)   . Anxiety   . Tobacco dependence   . Palpable abdominal aorta 06/28/2011    Aortic u/s 07/03/11 showed NO AAA.  Marland Kitchen HTN (hypertension)   . Allergic rhinitis   . Anal condylomata   . Gout     Past Surgical History  Procedure Laterality Date  . Skin cancer excision  05/2011    SSC from left side of face/neck  . Esophagogastroduodenoscopy  approx mid 1990s    pt reports normal.  . Anal wart excision      Family History  Problem Relation Age of Onset  . Hypertension Mother   . Cancer Father 68    lung cancer  . Hypertension Father   . Hypertension Paternal Grandfather     History   Social History  . Marital Status: Married    Spouse Name: Tammy  . Number of Children: 1  . Years of Education: N/A   Occupational History  . Not on file.   Social History Main Topics  . Smoking status: Current Every Day Smoker -- 0.50 packs/day    Types: Cigarettes  . Smokeless tobacco: Never Used  . Alcohol Use: Yes     Comment: beer 2-3 nights week  . Drug Use: No  . Sexual Activity: Not on file   Other Topics Concern  . Not on file   Social History Narrative   Married, one daughter who just graduated from [employer].   Has lived his entire life in [place], [place].   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   Ongoing tobacco dependence.    Drinks 3-4 beers several  nights per week.  No hx of alc abuse or drug use.   No exercise.    Outpatient Prescriptions Prior to Visit  Medication Sig Dispense Refill  . amLODipine-olmesartan (AZOR) 10-40 MG per tablet Take 1 tablet by mouth daily. 90 tablet 1  . colchicine 0.6 MG tablet 2 tabs po at onset of gout flare, then 1 tab 2 hours later.  Then 1 tab po bid beginning the following day until no further pain 30 tablet 1  . esomeprazole (NEXIUM) 40 MG capsule Take 1 capsule (40 mg total) by mouth daily before breakfast. 90 capsule 3  . fluticasone (FLONASE) 50 MCG/ACT nasal spray 2 sprays in each nostril once daily for nasal allergy symptoms 16 g 11  . ALPRAZolam (XANAX) 0.25 MG tablet Take 1 tablet (0.25 mg total) by mouth as needed. 30 tablet 5   No facility-administered medications prior to visit.    Allergies  Allergen Reactions  . Codeine     ROS Review of Systems  Constitutional: Negative for fever, chills, appetite change and fatigue.  HENT: Negative for congestion, dental problem, ear pain and sore throat.   Eyes: Negative for discharge, redness and visual  disturbance.  Respiratory: Negative for cough, chest tightness, shortness of breath and wheezing.   Cardiovascular: Negative for chest pain, palpitations and leg swelling.  Gastrointestinal: Negative for nausea, vomiting, abdominal pain, diarrhea and blood in stool.  Genitourinary: Negative for dysuria, urgency, frequency, hematuria, flank pain and difficulty urinating.  Musculoskeletal: Negative for myalgias, back pain, joint swelling, arthralgias and neck stiffness.  Skin: Negative for pallor and rash.  Neurological: Negative for dizziness, speech difficulty, weakness and headaches.  Hematological: Negative for adenopathy. Does not bruise/bleed easily.  Psychiatric/Behavioral: Negative for confusion and sleep disturbance. The patient is not nervous/anxious.     PE; Blood pressure 133/74, pulse 66, temperature 97.6 F (36.4 C), temperature  source Oral, resp. rate 16, height '5\' 11"'$  (1.803 m), weight 168 lb (76.204 kg), SpO2 99 %. Gen: Alert, well appearing.  Patient is oriented to person, place, time, and situation. AFFECT: pleasant, lucid thought and speech. ENT: Ears: EACs clear, normal epithelium.  TMs with good light reflex and landmarks bilaterally.  Eyes: no injection, icteris, swelling, or exudate.  EOMI, PERRLA. Nose: no drainage or turbinate edema/swelling.  No injection or focal lesion.  Mouth: lips without lesion/swelling.  Oral mucosa pink and moist.  Dentition intact and without obvious caries or gingival swelling.  Oropharynx without erythema, exudate, or swelling.  Neck: supple/nontender.  No LAD, mass, or TM.  Carotid pulses 2+ bilaterally, without bruits. CV: RRR, no m/r/g.   LUNGS: CTA bilat, nonlabored resps, good aeration in all lung fields. ABD: soft, NT, ND, BS normal.  No hepatospenomegaly or mass.  No bruits. EXT: no clubbing, cyanosis, or edema.  Musculoskeletal: no joint swelling, erythema, warmth, or tenderness.  ROM of all joints intact. Skin - no sores or suspicious lesions or rashes or color changes Rectal exam: negative without mass, lesions or tenderness, PROSTATE EXAM: smooth and symmetric without nodules or tenderness.  Pertinent labs:   Lab Results  Component Value Date   TSH 0.82 04/29/2015   Lab Results  Component Value Date   WBC 5.1 04/29/2015   HGB 14.6 04/29/2015   HCT 43.0 04/29/2015   MCV 98.0 04/29/2015   PLT 277.0 04/29/2015   Lab Results  Component Value Date   CREATININE 0.94 04/29/2015   BUN 22 04/29/2015   NA 137 04/29/2015   K 4.8 04/29/2015   CL 105 04/29/2015   CO2 28 04/29/2015   Lab Results  Component Value Date   ALT 22 04/29/2015   AST 28 04/29/2015   ALKPHOS 30* 04/29/2015   BILITOT 1.5* 04/29/2015   Lab Results  Component Value Date   CHOL 159 04/29/2015   Lab Results  Component Value Date   HDL 69.80 04/29/2015   Lab Results  Component Value  Date   LDLCALC 82 04/29/2015   Lab Results  Component Value Date   TRIG 38.0 04/29/2015   Lab Results  Component Value Date   CHOLHDL 2 04/29/2015   Lab Results  Component Value Date   PSA 0.50 04/29/2015   PSA 1.60 04/28/2014    ASSESSMENT AND PLAN:   Health maintenance exam: Reviewed age and gender appropriate health maintenance issues (prudent diet, regular exercise, health risks of tobacco and excessive alcohol, use of seatbelts, fire alarms in home, use of sunscreen).  Also reviewed age and gender appropriate health screening as well as vaccine recommendations. UTD on vaccines. DRE normal today, recent PSA normal. He'll call to request GI referral for colon cancer screening--if he doesn't call then we'll offer this referral  again at next f/u CPE in 1 yr. Recent IFG: discussed dx.  Plan is to repeat BMET today and check HbA1c today.  An After Visit Summary was printed and given to the patient.  FOLLOW UP:  Return in about 6 months (around 12/01/2015) for routine chronic illness f/u.

## 2015-07-21 ENCOUNTER — Other Ambulatory Visit: Payer: Self-pay | Admitting: *Deleted

## 2015-07-21 MED ORDER — AMLODIPINE-OLMESARTAN 10-40 MG PO TABS: 1.0000 | ORAL_TABLET | Freq: Every day | ORAL | Status: DC

## 2015-07-21 NOTE — Telephone Encounter (Signed)
RF request for Azor LOV: 06/01/15 Next ov: 12/01/15 Last written: 01/22/15 #90 w/ 1RF

## 2015-09-09 ENCOUNTER — Other Ambulatory Visit: Payer: Self-pay | Admitting: *Deleted

## 2015-09-09 MED ORDER — COLCHICINE 0.6 MG PO TABS: ORAL_TABLET | ORAL | Status: AC

## 2015-09-09 NOTE — Telephone Encounter (Signed)
RF request for colcrys LOV: 06/01/15 Next ov: 12/01/15 Last written: 08/01/15 #30 w/ 1RF

## 2015-11-08 ENCOUNTER — Ambulatory Visit (INDEPENDENT_AMBULATORY_CARE_PROVIDER_SITE_OTHER): Payer: BLUE CROSS/BLUE SHIELD

## 2015-11-08 DIAGNOSIS — Z23 Encounter for immunization: Secondary | ICD-10-CM

## 2015-12-01 ENCOUNTER — Ambulatory Visit (INDEPENDENT_AMBULATORY_CARE_PROVIDER_SITE_OTHER): Payer: BLUE CROSS/BLUE SHIELD | Admitting: Family Medicine

## 2015-12-01 ENCOUNTER — Encounter: Payer: Self-pay | Admitting: Family Medicine

## 2015-12-01 VITALS — BP 124/74 | HR 70 | Temp 97.5°F | Resp 16 | Wt 172.5 lb

## 2015-12-01 DIAGNOSIS — M722 Plantar fascial fibromatosis: Secondary | ICD-10-CM

## 2015-12-01 DIAGNOSIS — K219 Gastro-esophageal reflux disease without esophagitis: Secondary | ICD-10-CM | POA: Diagnosis not present

## 2015-12-01 DIAGNOSIS — F17200 Nicotine dependence, unspecified, uncomplicated: Secondary | ICD-10-CM

## 2015-12-01 DIAGNOSIS — F418 Other specified anxiety disorders: Secondary | ICD-10-CM | POA: Diagnosis not present

## 2015-12-01 DIAGNOSIS — I10 Essential (primary) hypertension: Secondary | ICD-10-CM

## 2015-12-01 NOTE — Progress Notes (Signed)
Pre visit review using our clinic review tool, if applicable. No additional management support is needed unless otherwise documented below in the visit note. 

## 2015-12-01 NOTE — Progress Notes (Signed)
OFFICE VISIT  12/01/2015   CC:  Chief Complaint  Patient presents with  . Follow-up    Pt is not fasting.   HPI:    Patient is a 59 y.o. Caucasian male who presents for 6 mo f/u HTN, GERD, anxiety, tob dependence. Been feeling good except R heel hurting last few weeks.  Always when wt on it, focal pain in heel only. Less this week than in prior two weeks.  No hx of similar in the past.  Left doesn't hurt at all. Seems rough in morning, gets a bit better for a while, then worse towards end of day. Shoes with good heel support help.  Cutting back on cigs: down to 1/2 pack a day avg. Takes xanax once daily.  Also compliant with bp pill qd. He is taking nexium prn, sometimes goes 3-5 d w/out taking it and does all right. Not much exercise. He monitors bp at home: 135-140, over 75-85.  He doesn't monitor his pulse.   Past Medical History  Diagnosis Date  . GERD (gastroesophageal reflux disease)   . Anxiety   . Tobacco dependence   . Palpable abdominal aorta 06/28/2011    Aortic u/s 07/03/11 showed NO AAA.  Marland Kitchen HTN (hypertension)   . Allergic rhinitis   . Anal condylomata   . Gout   . IFG (impaired fasting glucose) 2016    A1c 5.2% 05/2015    Past Surgical History  Procedure Laterality Date  . Skin cancer excision  05/2011    SSC from left side of face/neck  . Esophagogastroduodenoscopy  approx mid 1990s    pt reports normal.  . Anal wart excision      Outpatient Prescriptions Prior to Visit  Medication Sig Dispense Refill  . ALPRAZolam (XANAX) 0.25 MG tablet Take 1 tablet (0.25 mg total) by mouth as needed. 30 tablet 5  . amLODipine-olmesartan (AZOR) 10-40 MG per tablet Take 1 tablet by mouth daily. 90 tablet 1  . colchicine 0.6 MG tablet 2 tabs po at onset of gout flare, then 1 tab 2 hours later.  Then 1 tab po bid beginning the following day until no further pain 30 tablet 1  . esomeprazole (NEXIUM) 40 MG capsule Take 1 capsule (40 mg total) by mouth daily before  breakfast. 90 capsule 3  . fluticasone (FLONASE) 50 MCG/ACT nasal spray 2 sprays in each nostril once daily for nasal allergy symptoms 16 g 11   No facility-administered medications prior to visit.    Allergies  Allergen Reactions  . Codeine     ROS As per HPI  PE: Blood pressure 124/74, pulse 70, temperature 97.5 F (36.4 C), temperature source Oral, resp. rate 16, weight 172 lb 8 oz (78.245 kg), SpO2 99 %.BP repeat today 124/74 Gen: Alert, well appearing.  Patient is oriented to person, place, time, and situation. L foot: TTP focally on medial calcaneal tubercle, otherwise plantar surface nontender and achilles area nontender.  LABS:  Lab Results  Component Value Date   TSH 0.82 04/29/2015   Lab Results  Component Value Date   WBC 5.1 04/29/2015   HGB 14.6 04/29/2015   HCT 43.0 04/29/2015   MCV 98.0 04/29/2015   PLT 277.0 04/29/2015   Lab Results  Component Value Date   CREATININE 0.97 06/01/2015   BUN 16 06/01/2015   NA 138 06/01/2015   K 4.6 06/01/2015   CL 104 06/01/2015   CO2 30 06/01/2015   Lab Results  Component Value Date  ALT 22 04/29/2015   AST 28 04/29/2015   ALKPHOS 30* 04/29/2015   BILITOT 1.5* 04/29/2015   Lab Results  Component Value Date   CHOL 159 04/29/2015   Lab Results  Component Value Date   HDL 69.80 04/29/2015   Lab Results  Component Value Date   LDLCALC 82 04/29/2015   Lab Results  Component Value Date   TRIG 38.0 04/29/2015   Lab Results  Component Value Date   CHOLHDL 2 04/29/2015   Lab Results  Component Value Date   PSA 0.50 04/29/2015   PSA 1.60 04/28/2014    Lab Results  Component Value Date   HGBA1C 5.2 06/01/2015   IMPRESSION AND PLAN:  1) Left plantar fasciitis: discussed stretches, gave handout, recommended 1/4" heel insert in L shoe, discussed indolent nature of this problem, may take 6 mo to completely go away.  If worsening, may consider steroid injection.  2) HTN; The current medical regimen is  effective;  continue present plan and medications. Lytes/cr good 6 mo ago.  3) Tob dependence, ongoing.  Encouraged pt to completely quit but he wants to continue with the cutting back slowly method.  4) Anxiety, compliant with med, doing fine on regular dosing of xanax.  5) GERD: improved.  Able to use PPI prn now.  Continue to watch diet.  An After Visit Summary was printed and given to the patient.   FOLLOW UP: Return in about 6 months (around 05/31/2016) for annual CPE (fasting).

## 2015-12-19 DIAGNOSIS — E785 Hyperlipidemia, unspecified: Secondary | ICD-10-CM

## 2015-12-19 HISTORY — DX: Hyperlipidemia, unspecified: E78.5

## 2016-01-03 ENCOUNTER — Other Ambulatory Visit: Payer: Self-pay | Admitting: *Deleted

## 2016-01-03 MED ORDER — ALPRAZOLAM 0.25 MG PO TABS: 0.2500 mg | ORAL_TABLET | ORAL | Status: DC | PRN

## 2016-01-03 NOTE — Telephone Encounter (Signed)
Rx faxed

## 2016-01-03 NOTE — Telephone Encounter (Signed)
RF request for alprazolam LOV: 12/01/15 Next ov: 06/01/16 Last written: 06/01/15 #30 w/ 5RF  Please advise. Thanks.

## 2016-01-12 ENCOUNTER — Other Ambulatory Visit: Payer: Self-pay | Admitting: *Deleted

## 2016-01-12 MED ORDER — AMLODIPINE-OLMESARTAN 10-40 MG PO TABS: 1.0000 | ORAL_TABLET | Freq: Every day | ORAL | Status: DC

## 2016-01-12 NOTE — Telephone Encounter (Signed)
RF request for azor LOV: 12/01/15 Next ov: 06/01/16 Last written: 07/21/15 #90 w/ 1RF

## 2016-03-13 ENCOUNTER — Other Ambulatory Visit: Payer: Self-pay | Admitting: *Deleted

## 2016-03-13 MED ORDER — ESOMEPRAZOLE MAGNESIUM 40 MG PO CPDR: 40.0000 mg | DELAYED_RELEASE_CAPSULE | Freq: Every day | ORAL | Status: DC

## 2016-03-13 NOTE — Telephone Encounter (Signed)
RF request for esomeprazole LOV: 12/01/15 Next ov: 06/01/16 Last written: 12/30/14 #90 w/ 3RF

## 2016-06-01 ENCOUNTER — Encounter: Payer: Self-pay | Admitting: Family Medicine

## 2016-06-01 ENCOUNTER — Ambulatory Visit (INDEPENDENT_AMBULATORY_CARE_PROVIDER_SITE_OTHER): Payer: BLUE CROSS/BLUE SHIELD | Admitting: Family Medicine

## 2016-06-01 VITALS — BP 134/82 | HR 78 | Temp 98.1°F | Resp 16 | Ht 71.0 in | Wt 170.2 lb

## 2016-06-01 DIAGNOSIS — Z0000 Encounter for general adult medical examination without abnormal findings: Secondary | ICD-10-CM

## 2016-06-01 DIAGNOSIS — Z125 Encounter for screening for malignant neoplasm of prostate: Secondary | ICD-10-CM | POA: Diagnosis not present

## 2016-06-01 LAB — TSH: TSH: 1.12 u[IU]/mL (ref 0.35–4.50)

## 2016-06-01 LAB — CBC WITH DIFFERENTIAL/PLATELET
BASOS PCT: 0.5 % (ref 0.0–3.0)
Basophils Absolute: 0 10*3/uL (ref 0.0–0.1)
EOS ABS: 0.2 10*3/uL (ref 0.0–0.7)
EOS PCT: 2.8 % (ref 0.0–5.0)
HEMATOCRIT: 47.7 % (ref 39.0–52.0)
HEMOGLOBIN: 15.8 g/dL (ref 13.0–17.0)
LYMPHS PCT: 21.6 % (ref 12.0–46.0)
Lymphs Abs: 1.5 10*3/uL (ref 0.7–4.0)
MCHC: 33.2 g/dL (ref 30.0–36.0)
MCV: 90.2 fl (ref 78.0–100.0)
Monocytes Absolute: 0.6 10*3/uL (ref 0.1–1.0)
Monocytes Relative: 9.4 % (ref 3.0–12.0)
NEUTROS ABS: 4.5 10*3/uL (ref 1.4–7.7)
Neutrophils Relative %: 65.7 % (ref 43.0–77.0)
PLATELETS: 316 10*3/uL (ref 150.0–400.0)
RBC: 5.28 Mil/uL (ref 4.22–5.81)
RDW: 13.9 % (ref 11.5–15.5)
WBC: 6.9 10*3/uL (ref 4.0–10.5)

## 2016-06-01 LAB — LIPID PANEL
CHOL/HDL RATIO: 4
CHOLESTEROL: 203 mg/dL — AB (ref 0–200)
HDL: 52.6 mg/dL (ref >39.00)
LDL CALC: 121 mg/dL — AB (ref 0–99)
NONHDL: 150.07
Triglycerides: 143 mg/dL (ref 0.0–149.0)
VLDL: 28.6 mg/dL (ref 0.0–40.0)

## 2016-06-01 LAB — COMPREHENSIVE METABOLIC PANEL
ALBUMIN: 4.2 g/dL (ref 3.5–5.2)
ALK PHOS: 101 U/L (ref 39–117)
ALT: 20 U/L (ref 0–53)
AST: 17 U/L (ref 0–37)
BILIRUBIN TOTAL: 0.9 mg/dL (ref 0.2–1.2)
BUN: 17 mg/dL (ref 6–23)
CO2: 27 mEq/L (ref 19–32)
Calcium: 9 mg/dL (ref 8.4–10.5)
Chloride: 105 mEq/L (ref 96–112)
Creatinine, Ser: 0.94 mg/dL (ref 0.40–1.50)
GFR: 87.49 mL/min (ref >60.00)
Glucose, Bld: 105 mg/dL — ABNORMAL HIGH (ref 70–99)
POTASSIUM: 4.1 meq/L (ref 3.5–5.1)
SODIUM: 140 meq/L (ref 135–145)
Total Protein: 6.8 g/dL (ref 6.0–8.3)

## 2016-06-01 LAB — PSA: PSA: 1.66 ng/mL (ref 0.10–4.00)

## 2016-06-01 NOTE — Progress Notes (Signed)
Office Note 06/01/2016  CC:  Chief Complaint  Patient presents with  . Annual Exam    Pt is fasting.     HPI:  Todd Garner is a 60 y.o. White male who is here for annual health maintenance exam. Still smoking 1/2 ppd cigs.  Still trying to cut back. Discussed cologuard today and he wants to think about it.  Exercise: walks 3-5 miles per day. Eye exam coming up next week. Dental exams UTD.   Past Medical History  Diagnosis Date  . GERD (gastroesophageal reflux disease)   . Anxiety   . Tobacco dependence   . Palpable abdominal aorta 06/28/2011    Aortic u/s 07/03/11 showed NO AAA.  Marland Kitchen HTN (hypertension)   . Allergic rhinitis   . Anal condylomata   . Gout   . IFG (impaired fasting glucose) 2016    A1c 5.2% 05/2015    Past Surgical History  Procedure Laterality Date  . Skin cancer excision  05/2011    SSC from left side of face/neck  . Esophagogastroduodenoscopy  approx mid 1990s    pt reports normal.  . Anal wart excision      Family History  Problem Relation Age of Onset  . Hypertension Mother   . Cancer Father 18    lung cancer  . Hypertension Father   . Hypertension Paternal Grandfather     Social History   Social History  . Marital Status: Married    Spouse Name: Tammy  . Number of Children: 1  . Years of Education: N/A   Occupational History  . Not on file.   Social History Main Topics  . Smoking status: Current Every Day Smoker -- 0.50 packs/day    Types: Cigarettes  . Smokeless tobacco: Never Used  . Alcohol Use: Yes     Comment: beer 2-3 nights week  . Drug Use: No  . Sexual Activity: Not on file   Other Topics Concern  . Not on file   Social History Narrative   Married, one daughter who is a Research officer, political party.   Has lived his entire life in [place], [place].   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   Ongoing tobacco dependence.    Drinks 3-4 beers several nights per week.  No hx of alc abuse or drug  use.   No exercise.    Outpatient Prescriptions Prior to Visit  Medication Sig Dispense Refill  . ALPRAZolam (XANAX) 0.25 MG tablet Take 1 tablet (0.25 mg total) by mouth as needed. 30 tablet 5  . amLODipine-olmesartan (AZOR) 10-40 MG tablet Take 1 tablet by mouth daily. 90 tablet 3  . colchicine 0.6 MG tablet 2 tabs po at onset of gout flare, then 1 tab 2 hours later.  Then 1 tab po bid beginning the following day until no further pain 30 tablet 1  . esomeprazole (NEXIUM) 40 MG capsule Take 1 capsule (40 mg total) by mouth daily before breakfast. 90 capsule 3  . fluticasone (FLONASE) 50 MCG/ACT nasal spray 2 sprays in each nostril once daily for nasal allergy symptoms 16 g 11   No facility-administered medications prior to visit.    Allergies  Allergen Reactions  . Codeine     ROS Review of Systems  Constitutional: Negative for fever, chills, appetite change and fatigue.  HENT: Negative for congestion, dental problem, ear pain and sore throat.   Eyes: Negative for discharge, redness and visual disturbance.  Respiratory: Negative  for cough, chest tightness, shortness of breath and wheezing.   Cardiovascular: Negative for chest pain, palpitations and leg swelling.  Gastrointestinal: Negative for nausea, vomiting, abdominal pain, diarrhea and blood in stool.  Genitourinary: Negative for dysuria, urgency, frequency, hematuria, flank pain and difficulty urinating.  Musculoskeletal: Negative for myalgias, back pain, joint swelling, arthralgias and neck stiffness.  Skin: Negative for pallor and rash.  Neurological: Negative for dizziness, speech difficulty, weakness and headaches.  Hematological: Negative for adenopathy. Does not bruise/bleed easily.  Psychiatric/Behavioral: Negative for confusion and sleep disturbance. The patient is not nervous/anxious.     PE; Blood pressure 134/82, pulse 78, temperature 98.1 F (36.7 C), temperature source Oral, resp. rate 16, height '5\' 11"'$  (1.803  m), weight 170 lb 4 oz (77.225 kg), SpO2 95 %. Gen: Alert, well appearing.  Patient is oriented to person, place, time, and situation. AFFECT: pleasant, lucid thought and speech. ENT: Ears: EACs clear, normal epithelium.  TMs with good light reflex and landmarks bilaterally.  Eyes: no injection, icteris, swelling, or exudate.  EOMI, PERRLA. Nose: no drainage or turbinate edema/swelling.  No injection or focal lesion.  Mouth: lips without lesion/swelling.  Oral mucosa pink and moist.  Dentition intact and without obvious caries or gingival swelling.  Oropharynx without erythema, exudate, or swelling.  Neck: supple/nontender.  No LAD, mass, or TM.  Carotid pulses 2+ bilaterally, without bruits. CV: RRR, no m/r/g.   LUNGS: CTA bilat, nonlabored resps, good aeration in all lung fields. ABD: soft, NT, ND, BS normal.  No hepatospenomegaly or mass.  No bruits. EXT: no clubbing, cyanosis, or edema.  Musculoskeletal: no joint swelling, erythema, warmth, or tenderness.  ROM of all joints intact. Skin - no sores or suspicious lesions or rashes or color changes Rectal exam: negative without mass, lesions or tenderness, PROSTATE EXAM: smooth and symmetric without nodules or tenderness.  Pertinent labs:  Lab Results  Component Value Date   TSH 0.82 04/29/2015   Lab Results  Component Value Date   WBC 5.1 04/29/2015   HGB 14.6 04/29/2015   HCT 43.0 04/29/2015   MCV 98.0 04/29/2015   PLT 277.0 04/29/2015   Lab Results  Component Value Date   CREATININE 0.97 06/01/2015   BUN 16 06/01/2015   NA 138 06/01/2015   K 4.6 06/01/2015   CL 104 06/01/2015   CO2 30 06/01/2015   Lab Results  Component Value Date   ALT 22 04/29/2015   AST 28 04/29/2015   ALKPHOS 30* 04/29/2015   BILITOT 1.5* 04/29/2015   Lab Results  Component Value Date   CHOL 159 04/29/2015   Lab Results  Component Value Date   HDL 69.80 04/29/2015   Lab Results  Component Value Date   LDLCALC 82 04/29/2015   Lab Results   Component Value Date   TRIG 38.0 04/29/2015   Lab Results  Component Value Date   CHOLHDL 2 04/29/2015   Lab Results  Component Value Date   PSA 0.50 04/29/2015   PSA 1.60 04/28/2014   Lab Results  Component Value Date   HGBA1C 5.2 06/01/2015   ASSESSMENT AND PLAN:   Health maintenance exam: Reviewed age and gender appropriate health maintenance issues (prudent diet, regular exercise, health risks of tobacco and excessive alcohol, use of seatbelts, fire alarms in home, use of sunscreen).  Also reviewed age and gender appropriate health screening as well as vaccine recommendations. No vaccines due. Colon ca screening: pt still declines this for now--wants to think about cologuard.  Colonoscopy  would take up too much of his work time so he wants to avoid this if at all possible. Prostate ca screening: DRE normal, PSA drawn today. Encouraged smoking cessation.  An After Visit Summary was printed and given to the patient.  FOLLOW UP:  Return in about 6 months (around 12/01/2016) for routine chronic illness f/u.  Signed:  Crissie Sickles, MD           06/01/2016

## 2016-06-01 NOTE — Progress Notes (Signed)
Pre visit review using our clinic review tool, if applicable. No additional management support is needed unless otherwise documented below in the visit note. 

## 2016-06-06 ENCOUNTER — Other Ambulatory Visit: Payer: Self-pay | Admitting: *Deleted

## 2016-06-06 DIAGNOSIS — E785 Hyperlipidemia, unspecified: Secondary | ICD-10-CM

## 2016-06-06 MED ORDER — ATORVASTATIN CALCIUM 40 MG PO TABS: 40.0000 mg | ORAL_TABLET | Freq: Every day | ORAL | Status: DC

## 2016-07-01 DIAGNOSIS — J069 Acute upper respiratory infection, unspecified: Secondary | ICD-10-CM | POA: Diagnosis not present

## 2016-07-03 ENCOUNTER — Other Ambulatory Visit: Payer: Self-pay | Admitting: Family Medicine

## 2016-07-03 NOTE — Telephone Encounter (Signed)
Rx called into [facility]. Spoke to [place] and gave her verbal order to fill as directed.

## 2016-07-03 NOTE — Telephone Encounter (Signed)
RF request for alprazolam LOV: 06/01/16 Next ov: 12/01/16 Last written: 01/03/16 #30 w/ 5RF  Please advise. Thanks.

## 2016-09-08 DIAGNOSIS — L738 Other specified follicular disorders: Secondary | ICD-10-CM | POA: Diagnosis not present

## 2016-09-08 DIAGNOSIS — L821 Other seborrheic keratosis: Secondary | ICD-10-CM | POA: Diagnosis not present

## 2016-09-08 DIAGNOSIS — D225 Melanocytic nevi of trunk: Secondary | ICD-10-CM | POA: Diagnosis not present

## 2016-09-08 DIAGNOSIS — Z85828 Personal history of other malignant neoplasm of skin: Secondary | ICD-10-CM | POA: Diagnosis not present

## 2016-09-08 DIAGNOSIS — L570 Actinic keratosis: Secondary | ICD-10-CM | POA: Diagnosis not present

## 2016-09-08 DIAGNOSIS — D0421 Carcinoma in situ of skin of right ear and external auricular canal: Secondary | ICD-10-CM | POA: Diagnosis not present

## 2016-09-18 DIAGNOSIS — D0421 Carcinoma in situ of skin of right ear and external auricular canal: Secondary | ICD-10-CM | POA: Diagnosis not present

## 2016-11-30 NOTE — Progress Notes (Signed)
Pre visit review using our clinic review tool, if applicable. No additional management support is needed unless otherwise documented below in the visit note. 

## 2016-12-01 ENCOUNTER — Encounter: Payer: Self-pay | Admitting: Family Medicine

## 2016-12-01 ENCOUNTER — Ambulatory Visit (INDEPENDENT_AMBULATORY_CARE_PROVIDER_SITE_OTHER): Payer: BLUE CROSS/BLUE SHIELD | Admitting: Family Medicine

## 2016-12-01 VITALS — BP 149/77 | HR 77 | Temp 98.6°F | Resp 16 | Ht 71.0 in | Wt 174.5 lb

## 2016-12-01 DIAGNOSIS — E7800 Pure hypercholesterolemia, unspecified: Secondary | ICD-10-CM | POA: Diagnosis not present

## 2016-12-01 DIAGNOSIS — I10 Essential (primary) hypertension: Secondary | ICD-10-CM

## 2016-12-01 DIAGNOSIS — M7581 Other shoulder lesions, right shoulder: Secondary | ICD-10-CM | POA: Diagnosis not present

## 2016-12-01 DIAGNOSIS — F411 Generalized anxiety disorder: Secondary | ICD-10-CM | POA: Diagnosis not present

## 2016-12-01 LAB — COMPREHENSIVE METABOLIC PANEL
ALBUMIN: 4.3 g/dL (ref 3.5–5.2)
ALK PHOS: 93 U/L (ref 39–117)
ALT: 18 U/L (ref 0–53)
AST: 16 U/L (ref 0–37)
BUN: 15 mg/dL (ref 6–23)
CO2: 29 mEq/L (ref 19–32)
Calcium: 9.2 mg/dL (ref 8.4–10.5)
Chloride: 105 mEq/L (ref 96–112)
Creatinine, Ser: 0.97 mg/dL (ref 0.40–1.50)
GFR: 84.23 mL/min (ref >60.00)
GLUCOSE: 105 mg/dL — AB (ref 70–99)
POTASSIUM: 4.5 meq/L (ref 3.5–5.1)
Sodium: 140 mEq/L (ref 135–145)
TOTAL PROTEIN: 6.7 g/dL (ref 6.0–8.3)
Total Bilirubin: 0.8 mg/dL (ref 0.2–1.2)

## 2016-12-01 LAB — LIPID PANEL
CHOLESTEROL: 185 mg/dL (ref 0–200)
HDL: 50.9 mg/dL (ref >39.00)
LDL Cholesterol: 106 mg/dL — ABNORMAL HIGH (ref 0–99)
NonHDL: 134.05
TRIGLYCERIDES: 138 mg/dL (ref 0.0–149.0)
Total CHOL/HDL Ratio: 4
VLDL: 27.6 mg/dL (ref 0.0–40.0)

## 2016-12-01 MED ORDER — METHYLPREDNISOLONE ACETATE 40 MG/ML IJ SUSP
40.0000 mg | Freq: Once | INTRAMUSCULAR | Status: AC
  Administered 2016-12-01: 40 mg via INTRA_ARTICULAR

## 2016-12-01 NOTE — Progress Notes (Signed)
OFFICE VISIT  12/01/2016   CC:  Chief Complaint  Patient presents with  . Follow-up    RCI, pt is fasting.    HPI:    Patient is a 60 y.o.  male who presents for 6 mo f/u HTN, HLD, anxiety.  HLD: atorva rx'd 6 mo ago but he did not get this rx b/c he wanted to try to improve cholesterol with dietary changes.  Eating more cereals, fruits, trying to eat less carbs.    BP: home monitoring shows consistently <140/90.    Takes xanax for anxiety and "racing mind", mainly in evenings when trying to go to sleep.  Got flu vaccine 10/19/16, seemed to get R shoulder pain after the shot.  Worse with abduction.  Like a toothache.  Moving it helps some.  No tingling or numbness going down arm.  No hx of injury to the shoulder.  No meds consistently taken for it but advil early on did help the pain temporarily.  ROS: no CP, no SOB, no dizziness, no HA.  Past Medical History:  Diagnosis Date  . Allergic rhinitis   . Anal condylomata   . Anxiety   . GERD (gastroesophageal reflux disease)   . Gout   . HTN (hypertension)   . Hyperlipidemia   . IFG (impaired fasting glucose) 2016   A1c 5.2% 05/2015  . Palpable abdominal aorta 06/28/2011   Aortic u/s 07/03/11 showed NO AAA.  . Tobacco dependence     Past Surgical History:  Procedure Laterality Date  . anal wart excision    . ESOPHAGOGASTRODUODENOSCOPY  approx mid 1990s   pt reports normal.  . SKIN CANCER EXCISION  05/2011   SSC from left side of face/neck    Outpatient Medications Prior to Visit  Medication Sig Dispense Refill  . ALPRAZolam (XANAX) 0.25 MG tablet TAKE 1 TABLET BY MOUTH AS NEEDED 30 tablet 5  . amLODipine-olmesartan (AZOR) 10-40 MG tablet Take 1 tablet by mouth daily. 90 tablet 3  . colchicine 0.6 MG tablet 2 tabs po at onset of gout flare, then 1 tab 2 hours later.  Then 1 tab po bid beginning the following day until no further pain 30 tablet 1  . esomeprazole (NEXIUM) 40 MG capsule Take 1 capsule (40 mg total) by mouth  daily before breakfast. 90 capsule 3  . fluticasone (FLONASE) 50 MCG/ACT nasal spray 2 sprays in each nostril once daily for nasal allergy symptoms 16 g 11  . atorvastatin (LIPITOR) 40 MG tablet Take 1 tablet (40 mg total) by mouth daily. (Patient not taking: Reported on 12/01/2016) 30 tablet 3   No facility-administered medications prior to visit.     Allergies  Allergen Reactions  . Codeine     ROS As per HPI  PE: Blood pressure (!) 149/77, pulse 77, temperature 98.6 F (37 C), temperature source Oral, resp. rate 16, height '5\' 11"'$  (1.803 m), weight 174 lb 8 oz (79.2 kg), SpO2 95 %. Gen: Alert, well appearing.  Patient is oriented to person, place, time, and situation. AFFECT: pleasant, lucid thought and speech. R shoulder: mild TTP around hook of acromion.   No TTP of AC joint.   Speeds and Yergason's neg. Pain around acromion/upper deltoid with R arm aBduction and ER. IR ok. UA strength 5/5 prox/dist bilat.  LABS:  Lab Results  Component Value Date   TSH 1.12 06/01/2016   Lab Results  Component Value Date   WBC 6.9 06/01/2016   HGB 15.8 06/01/2016  HCT 47.7 06/01/2016   MCV 90.2 06/01/2016   PLT 316.0 06/01/2016   Lab Results  Component Value Date   CREATININE 0.94 06/01/2016   BUN 17 06/01/2016   NA 140 06/01/2016   K 4.1 06/01/2016   CL 105 06/01/2016   CO2 27 06/01/2016   Lab Results  Component Value Date   ALT 20 06/01/2016   AST 17 06/01/2016   ALKPHOS 101 06/01/2016   BILITOT 0.9 06/01/2016   Lab Results  Component Value Date   CHOL 203 (H) 06/01/2016   Lab Results  Component Value Date   HDL 52.60 06/01/2016   Lab Results  Component Value Date   LDLCALC 121 (H) 06/01/2016   Lab Results  Component Value Date   TRIG 143.0 06/01/2016   Lab Results  Component Value Date   CHOLHDL 4 06/01/2016   Lab Results  Component Value Date   PSA 1.66 06/01/2016   PSA 0.50 04/29/2015   PSA 1.60 04/28/2014   Lab Results  Component Value Date    HGBA1C 5.2 06/01/2015   IMPRESSION AND PLAN:  1) Right RC tendonitis: pt opted for steroid injection today (subacromial).  Procedure: Therapeutic RIGHT shoulder injection.  The patient's clinical condition is marked by substantial pain and/or significant functional disability.  Other conservative therapy has not provided relief, is contraindicated, or not appropriate.  There is a reasonable likelihood that injection will significantly improve the patient's pain and/or functional disability. Cleaned skin with alcohol swab, used posterolateral approach, Injected 1  ml of '40mg'$ /ml depomedrol + 2 ml of plain lidocaine into subacromial space without resistance.  No immediate complications.  Patient tolerated procedure well.  Post-injection care discussed, including 20 min of icing 1-2 times in the next 4-8 hours, frequent non weight-bearing ROM exercises over the next few days, and general pain medication management.  2) HTN; The current medical regimen is effective;  continue present plan and medications. Lytes/cr.  3) HLD: he has been trying TLC for the last 6 mo.  Recheck FLP today.  4) Anxiety; stable on xanax 0.25 mg qd prn.  An After Visit Summary was printed and given to the patient.  FOLLOW UP: Return in about 6 months (around 06/01/2017) for annual CPE (fasting).  Signed:  Crissie Sickles, MD           12/01/2016

## 2016-12-01 NOTE — Addendum Note (Signed)
Addended by: Gordy Councilman on: 12/01/2016 09:32 AM   Modules accepted: Orders

## 2016-12-29 ENCOUNTER — Other Ambulatory Visit: Payer: Self-pay | Admitting: *Deleted

## 2016-12-29 MED ORDER — AMLODIPINE-OLMESARTAN 10-40 MG PO TABS: 1.0000 | ORAL_TABLET | Freq: Every day | ORAL | 3 refills | Status: DC

## 2016-12-29 NOTE — Telephone Encounter (Signed)
Fax from [facility].  RF request for amlodipine-olmesartan LOV: 12/01/16 Next ov: None Last written: 01/12/16 #90 w/ 3Rf

## 2017-01-05 ENCOUNTER — Other Ambulatory Visit: Payer: Self-pay | Admitting: Family Medicine

## 2017-01-05 NOTE — Telephone Encounter (Signed)
Unable to fax Rx to [facility]. Rx called into [facility] spoke to [employer]. Printed Rx voided and put in spred box.

## 2017-01-05 NOTE — Telephone Encounter (Signed)
RF request for alprazolam LOV: 12/01/16 Next ov: None Last written: 07/03/16 #30 w/ 5RF  Please advise. Thanks.

## 2017-03-01 ENCOUNTER — Other Ambulatory Visit: Payer: Self-pay | Admitting: *Deleted

## 2017-03-01 MED ORDER — ESOMEPRAZOLE MAGNESIUM 40 MG PO CPDR: 40.0000 mg | DELAYED_RELEASE_CAPSULE | Freq: Every day | ORAL | 3 refills | Status: DC

## 2017-03-01 NOTE — Telephone Encounter (Signed)
[facility].  RF request for esomeprazole LOV: 12/01/16 Next ov: None Last written: 03/13/16 #90 w/ 3RF

## 2017-07-05 ENCOUNTER — Other Ambulatory Visit: Payer: Self-pay | Admitting: Family Medicine

## 2017-07-06 NOTE — Telephone Encounter (Signed)
Appt scheduled 08/03/17 for CPE.

## 2017-07-06 NOTE — Telephone Encounter (Signed)
Message left on voice mail to return call,

## 2017-07-06 NOTE — Telephone Encounter (Signed)
Will authorize 30 d supply. Pt is due for f/u (needs CPE, or at least chronic illness f/u).-thx

## 2017-07-06 NOTE — Telephone Encounter (Signed)
Rx faxed

## 2017-08-03 ENCOUNTER — Encounter: Payer: Self-pay | Admitting: Family Medicine

## 2017-08-03 ENCOUNTER — Ambulatory Visit (INDEPENDENT_AMBULATORY_CARE_PROVIDER_SITE_OTHER): Payer: BLUE CROSS/BLUE SHIELD | Admitting: Family Medicine

## 2017-08-03 VITALS — BP 147/77 | HR 71 | Temp 98.1°F | Resp 16 | Ht 71.5 in | Wt 165.5 lb

## 2017-08-03 DIAGNOSIS — Z0000 Encounter for general adult medical examination without abnormal findings: Secondary | ICD-10-CM

## 2017-08-03 DIAGNOSIS — Z1211 Encounter for screening for malignant neoplasm of colon: Secondary | ICD-10-CM | POA: Diagnosis not present

## 2017-08-03 DIAGNOSIS — Z125 Encounter for screening for malignant neoplasm of prostate: Secondary | ICD-10-CM

## 2017-08-03 LAB — COMPREHENSIVE METABOLIC PANEL
ALBUMIN: 4.2 g/dL (ref 3.5–5.2)
ALK PHOS: 92 U/L (ref 39–117)
ALT: 18 U/L (ref 0–53)
AST: 18 U/L (ref 0–37)
BUN: 13 mg/dL (ref 6–23)
CHLORIDE: 104 meq/L (ref 96–112)
CO2: 29 mEq/L (ref 19–32)
Calcium: 9.2 mg/dL (ref 8.4–10.5)
Creatinine, Ser: 0.94 mg/dL (ref 0.40–1.50)
GFR: 87.14 mL/min (ref >60.00)
GLUCOSE: 98 mg/dL (ref 70–99)
POTASSIUM: 4.4 meq/L (ref 3.5–5.1)
SODIUM: 140 meq/L (ref 135–145)
Total Bilirubin: 0.8 mg/dL (ref 0.2–1.2)
Total Protein: 6.6 g/dL (ref 6.0–8.3)

## 2017-08-03 LAB — CBC WITH DIFFERENTIAL/PLATELET
Basophils Absolute: 0.1 10*3/uL (ref 0.0–0.1)
Basophils Relative: 1.4 % (ref 0.0–3.0)
EOS PCT: 2.5 % (ref 0.0–5.0)
Eosinophils Absolute: 0.2 10*3/uL (ref 0.0–0.7)
HCT: 48.8 % (ref 39.0–52.0)
Hemoglobin: 16 g/dL (ref 13.0–17.0)
LYMPHS ABS: 1.2 10*3/uL (ref 0.7–4.0)
Lymphocytes Relative: 18.5 % (ref 12.0–46.0)
MCHC: 32.9 g/dL (ref 30.0–36.0)
MCV: 93 fl (ref 78.0–100.0)
MONO ABS: 0.6 10*3/uL (ref 0.1–1.0)
MONOS PCT: 9.8 % (ref 3.0–12.0)
NEUTROS ABS: 4.4 10*3/uL (ref 1.4–7.7)
Neutrophils Relative %: 67.8 % (ref 43.0–77.0)
PLATELETS: 362 10*3/uL (ref 150.0–400.0)
RBC: 5.25 Mil/uL (ref 4.22–5.81)
RDW: 14.1 % (ref 11.5–15.5)
WBC: 6.4 10*3/uL (ref 4.0–10.5)

## 2017-08-03 LAB — LIPID PANEL
CHOLESTEROL: 193 mg/dL (ref 0–200)
HDL: 54.9 mg/dL (ref >39.00)
LDL CALC: 112 mg/dL — AB (ref 0–99)
NONHDL: 138.31
Total CHOL/HDL Ratio: 4
Triglycerides: 132 mg/dL (ref 0.0–149.0)
VLDL: 26.4 mg/dL (ref 0.0–40.0)

## 2017-08-03 LAB — PSA: PSA: 1.89 ng/mL (ref 0.10–4.00)

## 2017-08-03 LAB — TSH: TSH: 0.87 u[IU]/mL (ref 0.35–4.50)

## 2017-08-03 MED ORDER — ALPRAZOLAM 0.25 MG PO TABS: 0.2500 mg | ORAL_TABLET | Freq: Every day | ORAL | 5 refills | Status: DC

## 2017-08-03 MED ORDER — VARENICLINE TARTRATE 1 MG PO TABS: 1.0000 mg | ORAL_TABLET | Freq: Two times a day (BID) | ORAL | 1 refills | Status: DC

## 2017-08-03 MED ORDER — VARENICLINE TARTRATE 0.5 MG X 11 & 1 MG X 42 PO MISC: ORAL | 0 refills | Status: DC

## 2017-08-03 NOTE — Patient Instructions (Signed)

## 2017-08-03 NOTE — Progress Notes (Signed)
Office Note 08/03/2017  CC:  Chief Complaint  Patient presents with  . Annual Exam    Pt is fasting.    HPI:  Todd Garner is a 61 y.o. male who is here for annual health maintenance exam. Feeling well.  Has a painful wart on side of left foot 5th toe.  Wants this taken care of at some point. Still smoking 10 cigs per day and wants to quit, wants to try chantix.  Past Medical History:  Diagnosis Date  . Allergic rhinitis   . Anal condylomata   . Anxiety   . GERD (gastroesophageal reflux disease)   . Gout   . HTN (hypertension)   . Hyperlipidemia 2017   Improved with TLC 11/2016  . IFG (impaired fasting glucose) 2016   A1c 5.2% 05/2015  . Palpable abdominal aorta 06/28/2011   Aortic u/s 07/03/11 showed NO AAA.  . Tobacco dependence    Trial of chantix 07/2017    Past Surgical History:  Procedure Laterality Date  . anal wart excision    . ESOPHAGOGASTRODUODENOSCOPY  approx mid 1990s   pt reports normal.  . SKIN CANCER EXCISION  05/2011   SSC from left side of face/neck    Family History  Problem Relation Age of Onset  . Hypertension Mother   . Cancer Father 73       lung cancer  . Hypertension Father   . Hypertension Paternal Grandfather     Social History   Social History  . Marital status: Married    Spouse name: Tammy  . Number of children: 1  . Years of education: N/A   Occupational History  . Not on file.   Social History Main Topics  . Smoking status: Current Every Day Smoker    Packs/day: 0.50    Types: Cigarettes  . Smokeless tobacco: Never Used  . Alcohol use Yes     Comment: beer 2-3 nights week  . Drug use: No  . Sexual activity: Not on file   Other Topics Concern  . Not on file   Social History Narrative   Married, one daughter who is a Research officer, political party.   Has lived his entire life in [place], [place].   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   Ongoing tobacco dependence.    Drinks 3-4 beers  several nights per week.  No hx of alc abuse or drug use.   No exercise.    Outpatient Medications Prior to Visit  Medication Sig Dispense Refill  . amLODipine-olmesartan (AZOR) 10-40 MG tablet Take 1 tablet by mouth daily. 90 tablet 3  . colchicine 0.6 MG tablet 2 tabs po at onset of gout flare, then 1 tab 2 hours later.  Then 1 tab po bid beginning the following day until no further pain 30 tablet 1  . esomeprazole (NEXIUM) 40 MG capsule Take 1 capsule (40 mg total) by mouth daily before breakfast. 90 capsule 3  . fluticasone (FLONASE) 50 MCG/ACT nasal spray 2 sprays in each nostril once daily for nasal allergy symptoms 16 g 11  . ALPRAZolam (XANAX) 0.25 MG tablet TAKE 1 TABLET DAILY 30 tablet 0   No facility-administered medications prior to visit.     Allergies  Allergen Reactions  . Codeine     ROS Review of Systems  Constitutional: Negative for appetite change, chills, fatigue and fever.  HENT: Negative for congestion, dental problem, ear pain and sore throat.   Eyes: Negative  for discharge, redness and visual disturbance.  Respiratory: Negative for cough, chest tightness, shortness of breath and wheezing.   Cardiovascular: Negative for chest pain, palpitations and leg swelling.  Gastrointestinal: Negative for abdominal pain, blood in stool, diarrhea, nausea and vomiting.  Genitourinary: Negative for difficulty urinating, dysuria, flank pain, frequency, hematuria and urgency.  Musculoskeletal: Negative for arthralgias, back pain, joint swelling, myalgias and neck stiffness.  Skin: Negative for pallor and rash.  Neurological: Negative for dizziness, speech difficulty, weakness and headaches.  Hematological: Negative for adenopathy. Does not bruise/bleed easily.  Psychiatric/Behavioral: Negative for confusion and sleep disturbance. The patient is not nervous/anxious.     PE; Blood pressure (!) 147/77, pulse 71, temperature 98.1 F (36.7 C), temperature source Oral, resp.  rate 16, height 5' 11.5" (1.816 m), weight 165 lb 8 oz (75.1 kg), SpO2 98 %. Gen: Alert, well appearing.  Patient is oriented to person, place, time, and situation. AFFECT: pleasant, lucid thought and speech. ENT: Ears: EACs clear, normal epithelium.  TMs with good light reflex and landmarks bilaterally.  Eyes: no injection, icteris, swelling, or exudate.  EOMI, PERRLA. Nose: no drainage or turbinate edema/swelling.  No injection or focal lesion.  Mouth: lips without lesion/swelling.  Oral mucosa pink and moist.  Dentition intact and without obvious caries or gingival swelling.  Oropharynx without erythema, exudate, or swelling.  Neck: supple/nontender.  No LAD, mass, or TM.  Carotid pulses 2+ bilaterally, without bruits. CV: RRR, no m/r/g.   LUNGS: CTA bilat, nonlabored resps, good aeration in all lung fields. ABD: soft, NT, ND, BS normal.  No hepatospenomegaly or mass.  No bruits. EXT: no clubbing, cyanosis, or edema.  Musculoskeletal: no joint swelling, erythema, warmth, or tenderness.  ROM of all joints intact. Skin - no sores or suspicious lesions or rashes or color changes Left foot 5th toe lateral aspect with small callus surrounding/covering a plantar wart. Rectal exam: negative without mass, lesions or tenderness, PROSTATE EXAM: smooth and symmetric without nodules or tenderness.   Pertinent labs:  Lab Results  Component Value Date   TSH 1.12 06/01/2016   Lab Results  Component Value Date   WBC 6.9 06/01/2016   HGB 15.8 06/01/2016   HCT 47.7 06/01/2016   MCV 90.2 06/01/2016   PLT 316.0 06/01/2016   Lab Results  Component Value Date   CREATININE 0.97 12/01/2016   BUN 15 12/01/2016   NA 140 12/01/2016   K 4.5 12/01/2016   CL 105 12/01/2016   CO2 29 12/01/2016   Lab Results  Component Value Date   ALT 18 12/01/2016   AST 16 12/01/2016   ALKPHOS 93 12/01/2016   BILITOT 0.8 12/01/2016   Lab Results  Component Value Date   CHOL 185 12/01/2016   Lab Results   Component Value Date   HDL 50.90 12/01/2016   Lab Results  Component Value Date   LDLCALC 106 (H) 12/01/2016   Lab Results  Component Value Date   TRIG 138.0 12/01/2016   Lab Results  Component Value Date   CHOLHDL 4 12/01/2016   Lab Results  Component Value Date   PSA 1.66 06/01/2016   PSA 0.50 04/29/2015   PSA 1.60 04/28/2014   Lab Results  Component Value Date   HGBA1C 5.2 06/01/2015    ASSESSMENT AND PLAN:   Health maintenance exam: Reviewed age and gender appropriate health maintenance issues (prudent diet, regular exercise, health risks of tobacco and excessive alcohol, use of seatbelts, fire alarms in home, use of sunscreen).  Also reviewed age and gender appropriate health screening as well as vaccine recommendations. Vaccines: UTD Labs: Fasting HP Prostate ca screening: DRE normal , PSA today. Colon ca screening: refer to GI for initial screening colonoscopy. Tob dependence: chantix rx'd today.  Therapeutic expectations and side effect profile of medication discussed today.  Patient's questions answered. He'll make separate procedure appt for me to shave his callus and freeze his plantar wart.  An After Visit Summary was printed and given to the patient.  FOLLOW UP:  Return in about 6 months (around 02/03/2018) for routine chronic illness f/u.  Signed:  Crissie Sickles, MD           08/03/2017

## 2017-08-15 ENCOUNTER — Other Ambulatory Visit: Payer: Self-pay | Admitting: *Deleted

## 2017-08-15 MED ORDER — FLUTICASONE PROPIONATE 50 MCG/ACT NA SUSP: NASAL | 3 refills | Status: AC

## 2017-08-15 NOTE — Telephone Encounter (Signed)
Rx sent to [facility] for 90 day supply.

## 2017-08-30 ENCOUNTER — Ambulatory Visit (INDEPENDENT_AMBULATORY_CARE_PROVIDER_SITE_OTHER): Payer: BLUE CROSS/BLUE SHIELD | Admitting: Family Medicine

## 2017-08-30 ENCOUNTER — Encounter: Payer: Self-pay | Admitting: Family Medicine

## 2017-08-30 VITALS — BP 110/58 | HR 66 | Temp 98.2°F | Resp 16 | Ht 71.5 in | Wt 162.8 lb

## 2017-08-30 DIAGNOSIS — L84 Corns and callosities: Secondary | ICD-10-CM

## 2017-08-30 DIAGNOSIS — B070 Plantar wart: Secondary | ICD-10-CM

## 2017-08-30 MED ORDER — ASPIRIN 81 MG PO TABS
81.0000 mg | ORAL_TABLET | Freq: Every day | ORAL | 0 refills | Status: DC
Start: 2017-08-30 — End: 2019-02-06

## 2017-08-30 NOTE — Progress Notes (Signed)
OFFICE VISIT  08/30/2017   CC:  Chief Complaint  Patient presents with  . Remove wart from toe   HPI:    Patient is a 61 y.o. Caucasian male who presents for wart treatment. Painful wart and callus on left foot 5th toe--lateral aspect.     Past Medical History:  Diagnosis Date  . Allergic rhinitis   . Anal condylomata   . Anxiety   . GERD (gastroesophageal reflux disease)   . Gout   . HTN (hypertension)   . Hyperlipidemia 2017   Improved with TLC 11/2016  . IFG (impaired fasting glucose) 2016   A1c 5.2% 05/2015  . Palpable abdominal aorta 06/28/2011   Aortic u/s 07/03/11 showed NO AAA.  . Tobacco dependence    Trial of chantix 07/2017    Past Surgical History:  Procedure Laterality Date  . anal wart excision    . ESOPHAGOGASTRODUODENOSCOPY  approx mid 1990s   pt reports normal.  . SKIN CANCER EXCISION  05/2011   SSC from left side of face/neck    Outpatient Medications Prior to Visit  Medication Sig Dispense Refill  . ALPRAZolam (XANAX) 0.25 MG tablet Take 1 tablet (0.25 mg total) by mouth daily. 30 tablet 5  . amLODipine-olmesartan (AZOR) 10-40 MG tablet Take 1 tablet by mouth daily. 90 tablet 3  . colchicine 0.6 MG tablet 2 tabs po at onset of gout flare, then 1 tab 2 hours later.  Then 1 tab po bid beginning the following day until no further pain 30 tablet 1  . esomeprazole (NEXIUM) 40 MG capsule Take 1 capsule (40 mg total) by mouth daily before breakfast. 90 capsule 3  . fluticasone (FLONASE) 50 MCG/ACT nasal spray 2 sprays in each nostril once daily for nasal allergy symptoms 48 g 3  . varenicline (CHANTIX STARTING MONTH PAK) 0.5 MG X 11 & 1 MG X 42 tablet Take one 0.5 mg tablet by mouth once daily for 3 days, then increase to one 0.5 mg tablet twice daily for 4 days, then increase to one 1 mg tablet twice daily. 53 tablet 0  . varenicline (CHANTIX CONTINUING MONTH PAK) 1 MG tablet Take 1 tablet (1 mg total) by mouth 2 (two) times daily. (Patient not taking:  Reported on 08/30/2017) 60 tablet 1   No facility-administered medications prior to visit.     Allergies  Allergen Reactions  . Codeine     ROS As per HPI  PE: Blood pressure (!) 110/58, pulse 66, temperature 98.2 F (36.8 C), temperature source Oral, resp. rate 16, height 5' 11.5" (1.816 m), weight 162 lb 12 oz (73.8 kg), SpO2 98 %. Gen: Alert, well appearing.  Patient is oriented to person, place, time, and situation. AFFECT: pleasant, lucid thought and speech. Left foot, 5th toe, lateral aspect with small callus and plantar wart abutting the toenail.   LABS:  none  IMPRESSION AND PLAN:  Left 5th toe callus with plantar wart. No sign of infection. I used a dermablade to shave down the callus after prepping skin with betadine. I then used histofreezer to freeze x 45 seconds.  I did 3 freeze/thaw cycles. Pt tolerated procedure well.  No immediate complications. Advised pt to soak in warm epsom salt bath x 20 min/day for the next 3 days. Tylenol q6h prn for any pain.  An After Visit Summary was printed and given to the patient.  FOLLOW UP: Return for as needed.  Signed:  Crissie Sickles, MD  08/30/2017     

## 2017-10-03 ENCOUNTER — Encounter: Payer: Self-pay | Admitting: Family Medicine

## 2017-11-06 DIAGNOSIS — L570 Actinic keratosis: Secondary | ICD-10-CM | POA: Diagnosis not present

## 2017-11-06 DIAGNOSIS — L853 Xerosis cutis: Secondary | ICD-10-CM | POA: Diagnosis not present

## 2017-11-06 DIAGNOSIS — Z85828 Personal history of other malignant neoplasm of skin: Secondary | ICD-10-CM | POA: Diagnosis not present

## 2017-11-06 DIAGNOSIS — D225 Melanocytic nevi of trunk: Secondary | ICD-10-CM | POA: Diagnosis not present

## 2017-11-06 DIAGNOSIS — D224 Melanocytic nevi of scalp and neck: Secondary | ICD-10-CM | POA: Diagnosis not present

## 2017-11-19 ENCOUNTER — Other Ambulatory Visit: Payer: Self-pay

## 2017-11-19 MED ORDER — ESOMEPRAZOLE MAGNESIUM 40 MG PO CPDR: 40.0000 mg | DELAYED_RELEASE_CAPSULE | Freq: Every day | ORAL | 3 refills | Status: DC

## 2017-12-05 ENCOUNTER — Other Ambulatory Visit: Payer: Self-pay

## 2017-12-05 DIAGNOSIS — I10 Essential (primary) hypertension: Secondary | ICD-10-CM

## 2017-12-05 MED ORDER — AMLODIPINE-OLMESARTAN 10-40 MG PO TABS: 1.0000 | ORAL_TABLET | Freq: Every day | ORAL | 3 refills | Status: DC

## 2017-12-05 NOTE — Telephone Encounter (Signed)
Refill on amlodipine sent to pharmacy.

## 2018-02-05 ENCOUNTER — Encounter: Payer: Self-pay | Admitting: Family Medicine

## 2018-02-05 ENCOUNTER — Ambulatory Visit: Payer: BLUE CROSS/BLUE SHIELD | Admitting: Family Medicine

## 2018-02-05 VITALS — BP 147/84 | HR 78 | Temp 98.1°F | Wt 165.0 lb

## 2018-02-05 DIAGNOSIS — I10 Essential (primary) hypertension: Secondary | ICD-10-CM

## 2018-02-05 LAB — BASIC METABOLIC PANEL
BUN: 13 mg/dL (ref 6–23)
CHLORIDE: 104 meq/L (ref 96–112)
CO2: 31 meq/L (ref 19–32)
CREATININE: 0.96 mg/dL (ref 0.40–1.50)
Calcium: 9.5 mg/dL (ref 8.4–10.5)
GFR: 84.9 mL/min (ref >60.00)
GLUCOSE: 100 mg/dL — AB (ref 70–99)
POTASSIUM: 4.8 meq/L (ref 3.5–5.1)
Sodium: 139 mEq/L (ref 135–145)

## 2018-02-05 LAB — LIPID PANEL
CHOL/HDL RATIO: 3
Cholesterol: 178 mg/dL (ref 0–200)
HDL: 53.7 mg/dL (ref >39.00)
LDL CALC: 108 mg/dL — AB (ref 0–99)
NONHDL: 124.68
Triglycerides: 84 mg/dL (ref 0.0–149.0)
VLDL: 16.8 mg/dL (ref 0.0–40.0)

## 2018-02-05 MED ORDER — ALPRAZOLAM 0.5 MG PO TABS: ORAL_TABLET | ORAL | 1 refills | Status: DC

## 2018-02-05 MED ORDER — PANTOPRAZOLE SODIUM 40 MG PO TBEC: 40.0000 mg | DELAYED_RELEASE_TABLET | Freq: Every day | ORAL | 3 refills | Status: DC

## 2018-02-05 NOTE — Progress Notes (Signed)
OFFICE VISIT  02/05/2018   CC:  Chief Complaint  Patient presents with  . Follow-up    [facility]   HPI:    Patient is a 62 y.o.  male who presents for f/u HTN, anxiety, tob dependence, GERD. No acute complaints.  Took chantix course.  Has cut back significantly: smoking anywhere from zero per day to 5 per day some days.  Weekends 10 per day.  HTN: occ home bp checks 120s/70s. Rushed to get here this morning.  Anxiety: has been on low dose xanax for 30 yrs, feels like this dose is not helpful anymore, has to take 2 to get antianxiety effect. No hx of abuse/addictive behavior.  Says this really calms him nicely when it does work and it has done so for years, no interest in starting daily antidepressant.  GERD: well controlled on nexium but insurer now not going to cover this med. Will try change to pantoprazole.   Past Medical History:  Diagnosis Date  . Allergic rhinitis   . Anal condylomata   . Anxiety   . GERD (gastroesophageal reflux disease)   . Gout   . HTN (hypertension)   . Hyperlipidemia 2017   Improved with TLC 11/2016  . IFG (impaired fasting glucose) 2016   A1c 5.2% 05/2015  . Palpable abdominal aorta 06/28/2011   Aortic u/s 07/03/11 showed NO AAA.  . Tobacco dependence    Trial of chantix 07/2017    Past Surgical History:  Procedure Laterality Date  . anal wart excision    . ESOPHAGOGASTRODUODENOSCOPY  approx mid 1990s   pt reports normal.  . SKIN CANCER EXCISION  05/2011   SSC from left side of face/neck    Outpatient Medications Prior to Visit  Medication Sig Dispense Refill  . amLODipine-olmesartan (AZOR) 10-40 MG tablet Take 1 tablet by mouth daily. 90 tablet 3  . aspirin 81 MG tablet Take 1 tablet (81 mg total) by mouth daily. 30 tablet 0  . colchicine 0.6 MG tablet 2 tabs po at onset of gout flare, then 1 tab 2 hours later.  Then 1 tab po bid beginning the following day until no further pain 30 tablet 1  . fluticasone (FLONASE) 50 MCG/ACT nasal spray 2  sprays in each nostril once daily for nasal allergy symptoms 48 g 3  . ALPRAZolam (XANAX) 0.25 MG tablet Take 1 tablet (0.25 mg total) by mouth daily. 30 tablet 5  . esomeprazole (NEXIUM) 40 MG capsule Take 1 capsule (40 mg total) by mouth daily before breakfast. 90 capsule 3  . varenicline (CHANTIX CONTINUING MONTH PAK) 1 MG tablet Take 1 tablet (1 mg total) by mouth 2 (two) times daily. 60 tablet 1  . varenicline (CHANTIX STARTING MONTH PAK) 0.5 MG X 11 & 1 MG X 42 tablet Take one 0.5 mg tablet by mouth once daily for 3 days, then increase to one 0.5 mg tablet twice daily for 4 days, then increase to one 1 mg tablet twice daily. 53 tablet 0   No facility-administered medications prior to visit.     Allergies  Allergen Reactions  . Codeine     ROS As per HPI  PE: Blood pressure (!) 147/84, pulse 78, temperature 98.1 F (36.7 C), temperature source Oral, weight 165 lb (74.8 kg), SpO2 96 %. Gen: Alert, well appearing.  Patient is oriented to person, place, time, and situation. AFFECT: pleasant, lucid thought and speech. No further exam today.  LABS:  Lab Results  Component Value Date  TSH 0.87 08/03/2017   Lab Results  Component Value Date   WBC 6.4 08/03/2017   HGB 16.0 08/03/2017   HCT 48.8 08/03/2017   MCV 93.0 08/03/2017   PLT 362.0 08/03/2017   Lab Results  Component Value Date   CREATININE 0.94 08/03/2017   BUN 13 08/03/2017   NA 140 08/03/2017   K 4.4 08/03/2017   CL 104 08/03/2017   CO2 29 08/03/2017   Lab Results  Component Value Date   ALT 18 08/03/2017   AST 18 08/03/2017   ALKPHOS 92 08/03/2017   BILITOT 0.8 08/03/2017   Lab Results  Component Value Date   CHOL 193 08/03/2017   Lab Results  Component Value Date   HDL 54.90 08/03/2017   Lab Results  Component Value Date   LDLCALC 112 (H) 08/03/2017   Lab Results  Component Value Date   TRIG 132.0 08/03/2017   Lab Results  Component Value Date   CHOLHDL 4 08/03/2017   Lab Results   Component Value Date   PSA 1.89 08/03/2017   PSA 1.66 06/01/2016   PSA 0.50 04/29/2015   Lab Results  Component Value Date   HGBA1C 5.2 06/01/2015   IMPRESSION AND PLAN:  1) HTN: The current medical regimen is effective;  continue present plan and medications. Lytes/cr today.  2) GAD, with situational anxiety: showing signs of tolerance to very low dose alprazolam that he has been on for many years. Will increase to 0.5mg  alprazolam, 1-2 tabs bid prn, #360, 1 RF.  3) Tobacco dependence: cut back significantly s/p chantix, but pt was strongly encouraged to completely quit smoking today.  4) GERD: well controlled on daily PPI. Insurer no longer covering esomeprazole, so will try rx for pantoprazole 40mg  qd.  An After Visit Summary was printed and given to the patient.  FOLLOW UP: Return in about 6 months (around 08/05/2018) for annual CPE (fasting).  Signed:  Crissie Sickles, MD           02/05/2018

## 2018-04-16 ENCOUNTER — Ambulatory Visit: Payer: Self-pay

## 2018-04-16 NOTE — Telephone Encounter (Signed)
Pt. Reports he was working outside over the weekend and pulled a muscle in his rib area in the front of his body. Will try home remedies and if he is no better, will call back.  Reason for Disposition . [1] MILD pain (e.g., does not interfere with normal activities) AND [2] present < 7 days  Answer Assessment - Initial Assessment Questions 1. ONSET: "When did the muscle aches or body pains start?"      Saturday after doing yard work and lifting 2. LOCATION: "What part of your body is hurting?" (e.g., entire body, arms, legs)      Right rib area 3. SEVERITY: "How bad is the pain?" (Scale 1-10; or mild, moderate, severe)   - MILD (1-3): doesn't interfere with normal activities    - MODERATE (4-7): interferes with normal activities or awakens from sleep    - SEVERE (8-10):  excruciating pain, unable to do any normal activities      Mild 4. CAUSE: "What do you think is causing the pains?"     Pulled muscle 5. FEVER: "Have you been having fever?"     No 6. OTHER SYMPTOMS: "Do you have any other symptoms?" (e.g., chest pain, weakness, rash, cold or flu symptoms, weight loss)     No 7. PREGNANCY: "Is there any chance you are pregnant?" "When was your last menstrual period?"     No 8. TRAVEL: "Have you traveled out of the country in the last month?" (e.g., travel history, exposures)     No  Protocols used: MUSCLE ACHES AND BODY PAIN-A-AH

## 2018-07-13 DIAGNOSIS — J0100 Acute maxillary sinusitis, unspecified: Secondary | ICD-10-CM | POA: Diagnosis not present

## 2018-07-13 DIAGNOSIS — I10 Essential (primary) hypertension: Secondary | ICD-10-CM | POA: Diagnosis not present

## 2018-07-13 DIAGNOSIS — H6123 Impacted cerumen, bilateral: Secondary | ICD-10-CM | POA: Diagnosis not present

## 2018-07-15 ENCOUNTER — Telehealth: Payer: Self-pay

## 2018-07-15 NOTE — Telephone Encounter (Signed)
[place] Night - Client TELEPHONE [facility] Patient Name: VALON GLASSCOCK Gender: Male DOB: 17-Jan-1956  Age: 62 Y 77 M 4 D Return Phone Number: [phone] (Primary) Address:  [place]  [address] Client [facility] Night - Client Client Site [place] [facility] Physician Crissie Sickles - MD Contact Type Call Who Is Calling Patient / Member / Family / Caregiver Call Type Triage / Clinical Caller Name Shriyan Arakawa Relationship To Patient Self Return Phone Number [phone] (Primary) Chief Complaint Headache Reason for Call Symptomatic / Request for [place] states he has had a sinus headache for days and need to know what to do for it. Translation No Nurse Assessment Nurse: Hammonds, RN, Lissa Date/Time (Eastern Time): 07/13/2018 2:03:45 PM Confirm and document reason for call. If symptomatic, describe symptoms. ---Caller states he has had a sinus headache for 3 days and need to know what to do for it. Pressure behind my eyes and forehead and pain is 5/10. I am able to sleep and no fever. I am blowing nose in mornings alot and clear. Does the patient have any new or worsening symptoms? ---Yes Will a triage be completed? ---Yes Related visit to physician within the last 2 weeks? ---No Does the PT have any chronic conditions? (i.e. diabetes, asthma, etc.) ---Yes List chronic conditions. ---HTN Is this a behavioral health or substance abuse call? ---No Guidelines Guideline Title Affirmed Question Affirmed Notes Nurse Date/Time (Eastern Time) Headache [1] MODERATE headache (e.g., interferes with normal activities) AND [2] present > 24 hours AND [3] unexplained (Exceptions: analgesics not tried, typical migraine, or headache part of viral illness)  Hammonds, RN, Lissa 07/13/2018 2:05:44 PM PLEASE NOTE:  All timestamps contained within this report  are represented as [place] Standard Time. CONFIDENTIALTY NOTICE: This fax transmission is intended only for the addressee.  It contains information that is legally privileged, confidential or otherwise protected from use or disclosure.  If you are not the intended recipient, you are strictly prohibited from reviewing, disclosing, copying using or disseminating any of this information or taking any action in reliance on or regarding this information.  If you have received this fax in error, please notify us immediately by telephone so that we can arrange for its return to [place]. Phone:  [phone], Toll-Free:  [phone], Fax:  [phone] Page: 2 of 2 Call Id: [id number] [place]. Time Eilene Ghazi Time) Disposition Final User 07/13/2018 2:10:16 PM See PCP within 24 Hours Yes Hammonds, RN, Lissa     Caller Disagree/Comply Comply Caller Understands Yes PreDisposition Did not know what to do Care Advice Given Per Guideline SEE PCP WITHIN 24 HOURS: * IF OFFICE WILL BE CLOSED AND NO PCP (PRIMARY CARE PROVIDER) SECONDLEVEL TRIAGE: You need to be seen within the next 24 hours. A clinic or an urgent care center is often a good source of care if your doctor's office is closed or you can't get an appointment. CALL BACK IF: * You become worse. CARE ADVICE given per Headache (Adult) guideline. Comments User: Moses Manners, RN Date/Time Eilene Ghazi Time): 07/13/2018 2:05:14 PM No known allergies except codeine. Referrals GO TO FACILITY UNDECIDED

## 2018-07-15 NOTE — Telephone Encounter (Signed)
Left message for pt to call back.   Pt needs to schedule an apt to be seen for treatment.

## 2018-07-15 NOTE — Telephone Encounter (Deleted)
[place] Night - Client TELEPHONE [facility] Patient Name: Todd Garner Gender: Male DOB: 1956-04-18  Age: 62 Y 92 M 4 D Return Phone Number: [phone] (Primary) Address:  [place]  [address] Client [facility] Night - Client Client Site [place] [facility] Physician Crissie Sickles - MD Contact Type Call Who Is Calling Patient / Member / Family / Caregiver Call Type Triage / Clinical Caller Name [name] Relationship To Patient Self Return Phone Number [phone] (Primary) Chief Complaint Headache Reason for Call Symptomatic / Request for [name] states he has had a sinus headache for days and need to know what to do for it. Translation No Nurse Assessment Nurse: Hammonds, RN, Lissa Date/Time (Eastern Time): 07/13/2018 2:03:45 PM Confirm and document reason for call. If symptomatic, describe symptoms. ---Caller states he has had a sinus headache for 3 days and need to know what to do for it. Pressure behind my eyes and forehead and pain is 5/10. I am able to sleep and no fever. I am blowing nose in mornings alot and clear. Does the patient have any new or worsening symptoms? ---Yes Will a triage be completed? ---Yes Related visit to physician within the last 2 weeks? ---No Does the PT have any chronic conditions? (i.e. diabetes, asthma, etc.) ---Yes List chronic conditions. ---HTN Is this a behavioral health or substance abuse call? ---No Guidelines Guideline Title Affirmed Question Affirmed Notes Nurse Date/Time (Eastern Time) Headache [1] MODERATE headache (e.g., interferes with normal activities) AND [2] present > 24 hours AND [3] unexplained (Exceptions: analgesics not tried, typical migraine, or headache part of viral illness)  Hammonds, RN, Lissa 07/13/2018 2:05:44 PM PLEASE NOTE:  All timestamps contained within this report  are represented as [place] Standard Time. CONFIDENTIALTY NOTICE: This fax transmission is intended only for the addressee.  It contains information that is legally privileged, confidential or otherwise protected from use or disclosure.  If you are not the intended recipient, you are strictly prohibited from reviewing, disclosing, copying using or disseminating any of this information or taking any action in reliance on or regarding this information.  If you have received this fax in error, please notify us immediately by telephone so that we can arrange for its return to [place]. Phone:  [phone], Toll-Free:  [phone], Fax:  [phone] Page: 2 of 2

## 2018-07-16 DIAGNOSIS — M9901 Segmental and somatic dysfunction of cervical region: Secondary | ICD-10-CM | POA: Diagnosis not present

## 2018-07-16 DIAGNOSIS — M5411 Radiculopathy, occipito-atlanto-axial region: Secondary | ICD-10-CM | POA: Diagnosis not present

## 2018-07-17 DIAGNOSIS — M9901 Segmental and somatic dysfunction of cervical region: Secondary | ICD-10-CM | POA: Diagnosis not present

## 2018-07-17 DIAGNOSIS — M5411 Radiculopathy, occipito-atlanto-axial region: Secondary | ICD-10-CM | POA: Diagnosis not present

## 2018-07-22 DIAGNOSIS — M9901 Segmental and somatic dysfunction of cervical region: Secondary | ICD-10-CM | POA: Diagnosis not present

## 2018-07-22 DIAGNOSIS — M5411 Radiculopathy, occipito-atlanto-axial region: Secondary | ICD-10-CM | POA: Diagnosis not present

## 2018-07-24 DIAGNOSIS — M9901 Segmental and somatic dysfunction of cervical region: Secondary | ICD-10-CM | POA: Diagnosis not present

## 2018-07-24 DIAGNOSIS — M5411 Radiculopathy, occipito-atlanto-axial region: Secondary | ICD-10-CM | POA: Diagnosis not present

## 2018-07-25 DIAGNOSIS — M9901 Segmental and somatic dysfunction of cervical region: Secondary | ICD-10-CM | POA: Diagnosis not present

## 2018-07-25 DIAGNOSIS — M5411 Radiculopathy, occipito-atlanto-axial region: Secondary | ICD-10-CM | POA: Diagnosis not present

## 2018-07-30 DIAGNOSIS — M5411 Radiculopathy, occipito-atlanto-axial region: Secondary | ICD-10-CM | POA: Diagnosis not present

## 2018-07-30 DIAGNOSIS — M9901 Segmental and somatic dysfunction of cervical region: Secondary | ICD-10-CM | POA: Diagnosis not present

## 2018-08-01 DIAGNOSIS — M5411 Radiculopathy, occipito-atlanto-axial region: Secondary | ICD-10-CM | POA: Diagnosis not present

## 2018-08-01 DIAGNOSIS — M9901 Segmental and somatic dysfunction of cervical region: Secondary | ICD-10-CM | POA: Diagnosis not present

## 2018-08-06 ENCOUNTER — Ambulatory Visit (INDEPENDENT_AMBULATORY_CARE_PROVIDER_SITE_OTHER): Payer: BLUE CROSS/BLUE SHIELD | Admitting: Family Medicine

## 2018-08-06 ENCOUNTER — Encounter: Payer: Self-pay | Admitting: Family Medicine

## 2018-08-06 VITALS — BP 147/76 | HR 80 | Temp 98.5°F | Resp 16 | Ht 71.5 in | Wt 164.2 lb

## 2018-08-06 DIAGNOSIS — Z0000 Encounter for general adult medical examination without abnormal findings: Secondary | ICD-10-CM

## 2018-08-06 DIAGNOSIS — M5411 Radiculopathy, occipito-atlanto-axial region: Secondary | ICD-10-CM | POA: Diagnosis not present

## 2018-08-06 DIAGNOSIS — Z1211 Encounter for screening for malignant neoplasm of colon: Secondary | ICD-10-CM

## 2018-08-06 DIAGNOSIS — Z23 Encounter for immunization: Secondary | ICD-10-CM | POA: Diagnosis not present

## 2018-08-06 DIAGNOSIS — M9901 Segmental and somatic dysfunction of cervical region: Secondary | ICD-10-CM | POA: Diagnosis not present

## 2018-08-06 DIAGNOSIS — E7800 Pure hypercholesterolemia, unspecified: Secondary | ICD-10-CM | POA: Diagnosis not present

## 2018-08-06 DIAGNOSIS — Z125 Encounter for screening for malignant neoplasm of prostate: Secondary | ICD-10-CM

## 2018-08-06 DIAGNOSIS — I10 Essential (primary) hypertension: Secondary | ICD-10-CM | POA: Diagnosis not present

## 2018-08-06 LAB — COMPREHENSIVE METABOLIC PANEL
ALBUMIN: 4.2 g/dL (ref 3.5–5.2)
ALT: 17 U/L (ref 0–53)
AST: 17 U/L (ref 0–37)
Alkaline Phosphatase: 82 U/L (ref 39–117)
BUN: 12 mg/dL (ref 6–23)
CALCIUM: 9.5 mg/dL (ref 8.4–10.5)
CHLORIDE: 105 meq/L (ref 96–112)
CO2: 30 meq/L (ref 19–32)
CREATININE: 1.07 mg/dL (ref 0.40–1.50)
GFR: 74.78 mL/min (ref >60.00)
Glucose, Bld: 100 mg/dL — ABNORMAL HIGH (ref 70–99)
Potassium: 4.8 mEq/L (ref 3.5–5.1)
SODIUM: 142 meq/L (ref 135–145)
Total Bilirubin: 0.9 mg/dL (ref 0.2–1.2)
Total Protein: 6.9 g/dL (ref 6.0–8.3)

## 2018-08-06 LAB — CBC WITH DIFFERENTIAL/PLATELET
BASOS PCT: 3.5 % — AB (ref 0.0–3.0)
Basophils Absolute: 0.2 10*3/uL — ABNORMAL HIGH (ref 0.0–0.1)
EOS ABS: 0.1 10*3/uL (ref 0.0–0.7)
Eosinophils Relative: 2.4 % (ref 0.0–5.0)
HCT: 46.8 % (ref 39.0–52.0)
Hemoglobin: 15.6 g/dL (ref 13.0–17.0)
LYMPHS ABS: 0.9 10*3/uL (ref 0.7–4.0)
Lymphocytes Relative: 18.2 % (ref 12.0–46.0)
MCHC: 33.3 g/dL (ref 30.0–36.0)
MCV: 91.6 fl (ref 78.0–100.0)
MONO ABS: 0.6 10*3/uL (ref 0.1–1.0)
Monocytes Relative: 10.8 % (ref 3.0–12.0)
NEUTROS ABS: 3.4 10*3/uL (ref 1.4–7.7)
Neutrophils Relative %: 65.1 % (ref 43.0–77.0)
PLATELETS: 349 10*3/uL (ref 150.0–400.0)
RBC: 5.11 Mil/uL (ref 4.22–5.81)
RDW: 14.3 % (ref 11.5–15.5)
WBC: 5.2 10*3/uL (ref 4.0–10.5)

## 2018-08-06 LAB — LIPID PANEL
CHOL/HDL RATIO: 3
CHOLESTEROL: 191 mg/dL (ref 0–200)
HDL: 61.5 mg/dL (ref >39.00)
LDL Cholesterol: 111 mg/dL — ABNORMAL HIGH (ref 0–99)
NonHDL: 129.39
TRIGLYCERIDES: 91 mg/dL (ref 0.0–149.0)
VLDL: 18.2 mg/dL (ref 0.0–40.0)

## 2018-08-06 LAB — PSA: PSA: 2.25 ng/mL (ref 0.10–4.00)

## 2018-08-06 NOTE — Progress Notes (Signed)
Office Note 08/06/2018  CC:  Chief Complaint  Patient presents with  . Annual Exam    Pt is fasting.     HPI:  Todd Garner is a 62 y.o. male who is here for annual health maintenance exam.  Of note, he has chronic anxiety and has not been interested in daily antidepressant treatment in the past. He has done well on alprazolam prn for many years.  Tobacco: 1.5 packs per week, worse on w/e's.    Exercise: working hard physically but no formal exercise. Diet: not working on anything in particular.    Past Medical History:  Diagnosis Date  . Allergic rhinitis   . Anal condylomata   . Anxiety   . GERD (gastroesophageal reflux disease)   . Gout   . HTN (hypertension)   . Hyperlipidemia 2017   Improved with TLC 11/2016  . IFG (impaired fasting glucose) 2016   A1c 5.2% 05/2015  . Palpable abdominal aorta 06/28/2011   Aortic u/s 07/03/11 showed NO AAA.  . Tobacco dependence    Trial of chantix 07/2017    Past Surgical History:  Procedure Laterality Date  . anal wart excision    . ESOPHAGOGASTRODUODENOSCOPY  approx mid 1990s   pt reports normal.  . SKIN CANCER EXCISION  05/2011   SSC from left side of face/neck    Family History  Problem Relation Age of Onset  . Hypertension Mother   . Cancer Father 100       lung cancer  . Hypertension Father   . Hypertension Paternal Grandfather     Social History   Socioeconomic History  . Marital status: Married    Spouse name: Tammy  . Number of children: 1  . Years of education: Not on file  . Highest education level: Not on file  Occupational History  . Not on file  Social Needs  . Financial resource strain: Not on file  . Food insecurity:    Worry: Not on file    Inability: Not on file  . Transportation needs:    Medical: Not on file    Non-medical: Not on file  Tobacco Use  . Smoking status: Current Every Day Smoker    Packs/day: 0.50    Types: Cigarettes  . Smokeless tobacco: Never Used  Substance and  Sexual Activity  . Alcohol use: Yes    Comment: beer 2-3 nights week  . Drug use: No  . Sexual activity: Not on file  Lifestyle  . Physical activity:    Days per week: Not on file    Minutes per session: Not on file  . Stress: Not on file  Relationships  . Social connections:    Talks on phone: Not on file    Gets together: Not on file    Attends religious service: Not on file    Active member of club or organization: Not on file    Attends meetings of clubs or organizations: Not on file    Relationship status: Not on file  . Intimate partner violence:    Fear of current or ex partner: Not on file    Emotionally abused: Not on file    Physically abused: Not on file    Forced sexual activity: Not on file  Other Topics Concern  . Not on file  Social History Narrative   Married, one daughter who is a Research officer, political party.   Has lived his entire life in [place], [place].   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   Ongoing tobacco dependence.    Drinks 3-4 beers several nights per week.  No hx of alc abuse or drug use.   No exercise.    Outpatient Medications Prior to Visit  Medication Sig Dispense Refill  . ALPRAZolam (XANAX) 0.5 MG tablet 1-2 tabs po bid prn 360 tablet 1  . amLODipine-olmesartan (AZOR) 10-40 MG tablet Take 1 tablet by mouth daily. 90 tablet 3  . colchicine 0.6 MG tablet 2 tabs po at onset of gout flare, then 1 tab 2 hours later.  Then 1 tab po bid beginning the following day until no further pain 30 tablet 1  . fluticasone (FLONASE) 50 MCG/ACT nasal spray 2 sprays in each nostril once daily for nasal allergy symptoms 48 g 3  . pantoprazole (PROTONIX) 40 MG tablet Take 1 tablet (40 mg total) by mouth daily. 90 tablet 3  . aspirin 81 MG tablet Take 1 tablet (81 mg total) by mouth daily. (Patient not taking: Reported on 08/06/2018) 30 tablet 0   No facility-administered medications prior to visit.     Allergies  Allergen Reactions  .  Codeine     ROS Review of Systems  Constitutional: Negative for appetite change, chills, fatigue and fever.  HENT: Negative for congestion, dental problem, ear pain and sore throat.   Eyes: Negative for discharge, redness and visual disturbance.  Respiratory: Negative for cough, chest tightness, shortness of breath and wheezing.   Cardiovascular: Negative for chest pain, palpitations and leg swelling.  Gastrointestinal: Negative for abdominal pain, blood in stool, diarrhea, nausea and vomiting.  Genitourinary: Negative for difficulty urinating, dysuria, flank pain, frequency, hematuria and urgency.  Musculoskeletal: Negative for arthralgias, back pain, joint swelling, myalgias and neck stiffness.  Skin: Negative for pallor and rash.  Neurological: Negative for dizziness, speech difficulty, weakness and headaches.  Hematological: Negative for adenopathy. Does not bruise/bleed easily.  Psychiatric/Behavioral: Negative for confusion and sleep disturbance. The patient is not nervous/anxious.     PE; Blood pressure (!) 147/76, pulse 80, temperature 98.5 F (36.9 C), temperature source Oral, resp. rate 16, height 5' 11.5" (1.816 m), weight 164 lb 4 oz (74.5 kg), SpO2 97 %. Body mass index is 22.59 kg/m.  Gen: Alert, well appearing.  Patient is oriented to person, place, time, and situation. AFFECT: pleasant, lucid thought and speech. ENT: Ears: EACs clear, normal epithelium.  TMs with good light reflex and landmarks bilaterally.  Eyes: no injection, icteris, swelling, or exudate.  EOMI, PERRLA. Nose: no drainage or turbinate edema/swelling.  No injection or focal lesion.  Mouth: lips without lesion/swelling.  Oral mucosa pink and moist.  Dentition intact and without obvious caries or gingival swelling.  Oropharynx without erythema, exudate, or swelling.  Neck: supple/nontender.  No LAD, mass, or TM.  Carotid pulses 2+ bilaterally, without bruits. CV: RRR, no m/r/g.   LUNGS: CTA bilat,  nonlabored resps, good aeration in all lung fields. ABD: soft, NT, ND, BS normal.  No hepatospenomegaly or mass.  No bruits. EXT: no clubbing, cyanosis, or edema.  Musculoskeletal: no joint swelling, erythema, warmth, or tenderness.  ROM of all joints intact. Skin - no sores or suspicious lesions or rashes or color changes Rectal exam: negative without mass, lesions or tenderness, PROSTATE EXAM: smooth and symmetric without nodules or tenderness.  He has 12-15 small anal condylomata.   Pertinent labs:  Lab Results  Component Value Date   TSH 0.87 08/03/2017   Lab Results  Component Value Date   WBC 6.4 08/03/2017   HGB 16.0 08/03/2017   HCT 48.8 08/03/2017   MCV 93.0 08/03/2017   PLT 362.0 08/03/2017   Lab Results  Component Value Date   CREATININE 0.96 02/05/2018   BUN 13 02/05/2018   NA 139 02/05/2018   K 4.8 02/05/2018   CL 104 02/05/2018   CO2 31 02/05/2018   Lab Results  Component Value Date   ALT 18 08/03/2017   AST 18 08/03/2017   ALKPHOS 92 08/03/2017   BILITOT 0.8 08/03/2017   Lab Results  Component Value Date   CHOL 178 02/05/2018   Lab Results  Component Value Date   HDL 53.70 02/05/2018   Lab Results  Component Value Date   LDLCALC 108 (H) 02/05/2018   Lab Results  Component Value Date   TRIG 84.0 02/05/2018   Lab Results  Component Value Date   CHOLHDL 3 02/05/2018   Lab Results  Component Value Date   PSA 1.89 08/03/2017   PSA 1.66 06/01/2016   PSA 0.50 04/29/2015   Lab Results  Component Value Date   HGBA1C 5.2 06/01/2015    ASSESSMENT AND PLAN:   Health maintenance exam: Reviewed age and gender appropriate health maintenance issues (prudent diet, regular exercise, health risks of tobacco and excessive alcohol, use of seatbelts, fire alarms in home, use of sunscreen).  Also reviewed age and gender appropriate health screening as well as vaccine recommendations. Vaccines: Flu vaccine-->given today.   Shingrix discussed-->pt to  consider.   Pneumovax 23 (current smoker)-->pt to consider. Labs: fasting HP, PSA Prostate ca screening: DRE normal today , PSA. Colon ca screening: referred him last year to GI for initial screening colonoscopy but he never got this--> GI referral ordered again today.  Anxiety is well controlled on prn alpraz--uses this infrequently.  Discussed CSC today but I inadvertently forgot to get him to read and sign this today.  Will get him to do this at next f/u visit in 6 mo.  Regarding his anal condylomata, he asked about treatments available besides excision. I told him aldara cream can be effective, but he was more comfortable at this time just considering the rx and will get back to [place] if he wants this.  An After Visit Summary was printed and given to the patient.  FOLLOW UP:  Return in about 6 months (around 02/06/2019) for routine chronic illness f/u.   Signed:  Crissie Sickles, MD           08/06/2018

## 2018-08-06 NOTE — Patient Instructions (Signed)

## 2018-08-07 ENCOUNTER — Encounter: Payer: Self-pay | Admitting: Family Medicine

## 2018-08-08 ENCOUNTER — Telehealth: Payer: Self-pay | Admitting: Family Medicine

## 2018-08-08 DIAGNOSIS — M5411 Radiculopathy, occipito-atlanto-axial region: Secondary | ICD-10-CM | POA: Diagnosis not present

## 2018-08-08 DIAGNOSIS — M9901 Segmental and somatic dysfunction of cervical region: Secondary | ICD-10-CM | POA: Diagnosis not present

## 2018-08-08 NOTE — Telephone Encounter (Signed)
See result note.  

## 2018-08-08 NOTE — Telephone Encounter (Addendum)
Copied from [employer] [id number]. Topic: Quick Communication - Lab Results >> Aug 07, 2018 10:33 AM Onalee Hua, CMA wrote: See lab result note dated 08/06/18. Result note has been sent to [facility].  Okay for PEC to discuss results/PCP recommendations. Please call his cell phone [phone]

## 2018-08-13 DIAGNOSIS — M9901 Segmental and somatic dysfunction of cervical region: Secondary | ICD-10-CM | POA: Diagnosis not present

## 2018-08-13 DIAGNOSIS — M5411 Radiculopathy, occipito-atlanto-axial region: Secondary | ICD-10-CM | POA: Diagnosis not present

## 2018-08-20 DIAGNOSIS — M9901 Segmental and somatic dysfunction of cervical region: Secondary | ICD-10-CM | POA: Diagnosis not present

## 2018-08-20 DIAGNOSIS — M5411 Radiculopathy, occipito-atlanto-axial region: Secondary | ICD-10-CM | POA: Diagnosis not present

## 2018-08-27 DIAGNOSIS — M5411 Radiculopathy, occipito-atlanto-axial region: Secondary | ICD-10-CM | POA: Diagnosis not present

## 2018-08-27 DIAGNOSIS — M9901 Segmental and somatic dysfunction of cervical region: Secondary | ICD-10-CM | POA: Diagnosis not present

## 2018-09-17 DIAGNOSIS — M9901 Segmental and somatic dysfunction of cervical region: Secondary | ICD-10-CM | POA: Diagnosis not present

## 2018-09-17 DIAGNOSIS — M5411 Radiculopathy, occipito-atlanto-axial region: Secondary | ICD-10-CM | POA: Diagnosis not present

## 2018-09-30 ENCOUNTER — Encounter: Payer: Self-pay | Admitting: Family Medicine

## 2018-10-16 DIAGNOSIS — M9901 Segmental and somatic dysfunction of cervical region: Secondary | ICD-10-CM | POA: Diagnosis not present

## 2018-10-16 DIAGNOSIS — M5411 Radiculopathy, occipito-atlanto-axial region: Secondary | ICD-10-CM | POA: Diagnosis not present

## 2018-11-13 DIAGNOSIS — M9901 Segmental and somatic dysfunction of cervical region: Secondary | ICD-10-CM | POA: Diagnosis not present

## 2018-11-13 DIAGNOSIS — M5411 Radiculopathy, occipito-atlanto-axial region: Secondary | ICD-10-CM | POA: Diagnosis not present

## 2018-11-22 ENCOUNTER — Other Ambulatory Visit: Payer: Self-pay | Admitting: Family Medicine

## 2018-11-22 DIAGNOSIS — I10 Essential (primary) hypertension: Secondary | ICD-10-CM

## 2018-12-05 DIAGNOSIS — M5411 Radiculopathy, occipito-atlanto-axial region: Secondary | ICD-10-CM | POA: Diagnosis not present

## 2018-12-05 DIAGNOSIS — M9901 Segmental and somatic dysfunction of cervical region: Secondary | ICD-10-CM | POA: Diagnosis not present

## 2018-12-25 DIAGNOSIS — L814 Other melanin hyperpigmentation: Secondary | ICD-10-CM | POA: Diagnosis not present

## 2018-12-25 DIAGNOSIS — Z85828 Personal history of other malignant neoplasm of skin: Secondary | ICD-10-CM | POA: Diagnosis not present

## 2018-12-25 DIAGNOSIS — D2372 Other benign neoplasm of skin of left lower limb, including hip: Secondary | ICD-10-CM | POA: Diagnosis not present

## 2018-12-25 DIAGNOSIS — L570 Actinic keratosis: Secondary | ICD-10-CM | POA: Diagnosis not present

## 2018-12-25 DIAGNOSIS — D225 Melanocytic nevi of trunk: Secondary | ICD-10-CM | POA: Diagnosis not present

## 2019-01-06 DIAGNOSIS — M5411 Radiculopathy, occipito-atlanto-axial region: Secondary | ICD-10-CM | POA: Diagnosis not present

## 2019-01-06 DIAGNOSIS — M9901 Segmental and somatic dysfunction of cervical region: Secondary | ICD-10-CM | POA: Diagnosis not present

## 2019-01-17 ENCOUNTER — Other Ambulatory Visit: Payer: Self-pay | Admitting: Family Medicine

## 2019-01-27 DIAGNOSIS — M5411 Radiculopathy, occipito-atlanto-axial region: Secondary | ICD-10-CM | POA: Diagnosis not present

## 2019-01-27 DIAGNOSIS — M9901 Segmental and somatic dysfunction of cervical region: Secondary | ICD-10-CM | POA: Diagnosis not present

## 2019-02-06 ENCOUNTER — Encounter: Payer: Self-pay | Admitting: *Deleted

## 2019-02-06 ENCOUNTER — Encounter: Payer: Self-pay | Admitting: Family Medicine

## 2019-02-06 ENCOUNTER — Ambulatory Visit: Payer: BLUE CROSS/BLUE SHIELD | Admitting: Family Medicine

## 2019-02-06 VITALS — BP 140/72 | HR 81 | Temp 98.1°F | Resp 16 | Ht 71.5 in | Wt 169.0 lb

## 2019-02-06 DIAGNOSIS — I10 Essential (primary) hypertension: Secondary | ICD-10-CM

## 2019-02-06 DIAGNOSIS — F17200 Nicotine dependence, unspecified, uncomplicated: Secondary | ICD-10-CM

## 2019-02-06 DIAGNOSIS — Z79899 Other long term (current) drug therapy: Secondary | ICD-10-CM

## 2019-02-06 DIAGNOSIS — F411 Generalized anxiety disorder: Secondary | ICD-10-CM | POA: Diagnosis not present

## 2019-02-06 MED ORDER — AMLODIPINE-OLMESARTAN 10-40 MG PO TABS: 1.0000 | ORAL_TABLET | Freq: Every day | ORAL | 1 refills | Status: DC

## 2019-02-06 MED ORDER — METOPROLOL SUCCINATE ER 25 MG PO TB24: ORAL_TABLET | ORAL | 1 refills | Status: DC

## 2019-02-06 NOTE — Addendum Note (Signed)
Addended by: Caroll Rancher L on: 02/06/2019 04:02 PM   Modules accepted: Orders

## 2019-02-06 NOTE — Progress Notes (Signed)
OFFICE VISIT  02/06/2019   CC:  Chief Complaint  Patient presents with  . Follow-up    RCI, pt is not fasting.      HPI:    Patient is a 63 y.o. Caucasian male who presents for 6 mo f/u HTN, gad, tobacco dependence.  HTN: no home measurements.  Compliant with meds.  No hx of wheezing.    Anxiety: long term prn use of alprazolam.  Has declined trial of any antidepressants many times in the past.  Not using this med very frequently.  Mood is fine.  Tobacco: still smoking, but has cut back to 1/2 pack/day. He is not motivated to quit "cold [place]" at this time.  ROS: no CP, no SOB, no wheezing, no cough, no dizziness, no HAs, no rashes, no melena/hematochezia.  No polyuria or polydipsia.     Past Medical History:  Diagnosis Date  . Allergic rhinitis   . Anal condylomata   . Anxiety   . GERD (gastroesophageal reflux disease)   . Gout   . HTN (hypertension)   . Hyperlipidemia 2017   Improved with TLC 11/2016  . IFG (impaired fasting glucose) 2016   A1c 5.2% 05/2015  . Palpable abdominal aorta 06/28/2011   Aortic u/s 07/03/11 showed NO AAA.  . Tobacco dependence    Trial of chantix 07/2017.  Ongoing, although much less, as of 07/2018.    Past Surgical History:  Procedure Laterality Date  . anal wart excision    . ESOPHAGOGASTRODUODENOSCOPY  approx mid 1990s   pt reports normal.  . SKIN CANCER EXCISION  05/2011   SSC from left side of face/neck    Outpatient Medications Prior to Visit  Medication Sig Dispense Refill  . ALPRAZolam (XANAX) 0.5 MG tablet 1-2 tabs po bid prn 360 tablet 1  . colchicine 0.6 MG tablet 2 tabs po at onset of gout flare, then 1 tab 2 hours later.  Then 1 tab po bid beginning the following day until no further pain 30 tablet 1  . fluticasone (FLONASE) 50 MCG/ACT nasal spray 2 sprays in each nostril once daily for nasal allergy symptoms 48 g 3  . pantoprazole (PROTONIX) 40 MG tablet TAKE 1 TABLET BY MOUTH EVERY DAY 90 tablet 3  .  amLODipine-olmesartan (AZOR) 10-40 MG tablet TAKE 1 TABLET BY MOUTH EVERY DAY 90 tablet 1  . aspirin 81 MG tablet Take 1 tablet (81 mg total) by mouth daily. (Patient not taking: Reported on 08/06/2018) 30 tablet 0   No facility-administered medications prior to visit.     Allergies  Allergen Reactions  . Codeine     ROS As per HPI  PE: initial bp today 169/66, repeat 140/72 at end of visit. Blood pressure 140/72, pulse 81, temperature 98.1 F (36.7 C), temperature source Oral, resp. rate 16, height 5' 11.5" (1.816 m), weight 169 lb (76.7 kg), SpO2 98 %. Gen: Alert, well appearing.  Patient is oriented to person, place, time, and situation. AFFECT: pleasant, lucid thought and speech. CV: RRR, no m/r/g.   LUNGS: CTA bilat, nonlabored resps, good aeration in all lung fields. EXT: no clubbing or cyanosis.  no edema.   LABS:    Chemistry      Component Value Date/Time   NA 142 08/06/2018 0849   K 4.8 08/06/2018 0849   CL 105 08/06/2018 0849   CO2 30 08/06/2018 0849   BUN 12 08/06/2018 0849   CREATININE 1.07 08/06/2018 0849      Component Value Date/Time  CALCIUM 9.5 08/06/2018 0849   ALKPHOS 82 08/06/2018 0849   AST 17 08/06/2018 0849   ALT 17 08/06/2018 0849   BILITOT 0.9 08/06/2018 0849      IMPRESSION AND PLAN:  1) HTN: not well controlled. Add toprol xl 25mg  qd. BMET today. Monitor bp at home regularly, goal bp 130/80.  2) Tobacco dependence: cutting back well but I encouraged him to completely quit.  3) Anxiety: doing well/stable with prn use of alprazolam. No new rx needed today. Controlled substance contract reviewed with patient today.  Patient signed this and it will be placed in the chart.   UDS next f/u visit.  An After Visit Summary was printed and given to the patient.  FOLLOW UP: Return for f/u 2-4 wks HTN.  Signed:  Crissie Sickles, MD           02/06/2019

## 2019-02-07 LAB — BASIC METABOLIC PANEL
BUN: 11 mg/dL (ref 7–25)
CO2: 27 mmol/L (ref 20–32)
Calcium: 9.1 mg/dL (ref 8.6–10.3)
Chloride: 105 mmol/L (ref 98–110)
Creat: 1.02 mg/dL (ref 0.70–1.25)
Glucose, Bld: 95 mg/dL (ref 65–99)
Potassium: 4.3 mmol/L (ref 3.5–5.3)
Sodium: 140 mmol/L (ref 135–146)

## 2019-03-01 ENCOUNTER — Other Ambulatory Visit: Payer: Self-pay | Admitting: Family Medicine

## 2019-04-01 ENCOUNTER — Other Ambulatory Visit: Payer: Self-pay | Admitting: Family Medicine

## 2019-04-02 NOTE — Telephone Encounter (Signed)
RF request for alprazolam Last OV 02/06/2019 Next OV NONE Last RX 02/05/2018 # 360 x 1 RF.  Please advise.

## 2019-08-11 ENCOUNTER — Other Ambulatory Visit: Payer: Self-pay | Admitting: Family Medicine

## 2019-08-11 DIAGNOSIS — I10 Essential (primary) hypertension: Secondary | ICD-10-CM

## 2019-11-02 ENCOUNTER — Other Ambulatory Visit: Payer: Self-pay | Admitting: Family Medicine

## 2019-11-02 DIAGNOSIS — I10 Essential (primary) hypertension: Secondary | ICD-10-CM

## 2019-11-06 ENCOUNTER — Other Ambulatory Visit: Payer: Self-pay | Admitting: Family Medicine

## 2019-11-06 DIAGNOSIS — I10 Essential (primary) hypertension: Secondary | ICD-10-CM

## 2019-11-11 ENCOUNTER — Other Ambulatory Visit: Payer: Self-pay | Admitting: Family Medicine

## 2019-11-11 DIAGNOSIS — I10 Essential (primary) hypertension: Secondary | ICD-10-CM

## 2019-11-12 ENCOUNTER — Other Ambulatory Visit: Payer: Self-pay

## 2019-11-12 ENCOUNTER — Telehealth: Payer: Self-pay | Admitting: Family Medicine

## 2019-11-12 DIAGNOSIS — I10 Essential (primary) hypertension: Secondary | ICD-10-CM

## 2019-11-12 MED ORDER — AMLODIPINE-OLMESARTAN 10-40 MG PO TABS: 1.0000 | ORAL_TABLET | Freq: Every day | ORAL | 0 refills | Status: DC

## 2019-11-12 NOTE — Telephone Encounter (Signed)
Patient has been scheduled for next f/u appt on 12/2, 90 d/s sent.

## 2019-11-12 NOTE — Telephone Encounter (Signed)
Patient request 90 d/s on refill - Patient is out of meds. Patient states he needs new prescription for the 90 d/s. Thank you  amLODipine-olmesartan (AZOR) 10-40 MG tablet  [facility] [place]

## 2019-11-19 ENCOUNTER — Other Ambulatory Visit: Payer: Self-pay

## 2019-11-19 ENCOUNTER — Encounter: Payer: Self-pay | Admitting: Family Medicine

## 2019-11-19 ENCOUNTER — Ambulatory Visit (INDEPENDENT_AMBULATORY_CARE_PROVIDER_SITE_OTHER): Payer: BC Managed Care – PPO | Admitting: Family Medicine

## 2019-11-19 VITALS — BP 152/81

## 2019-11-19 DIAGNOSIS — I10 Essential (primary) hypertension: Secondary | ICD-10-CM | POA: Diagnosis not present

## 2019-11-19 DIAGNOSIS — Z125 Encounter for screening for malignant neoplasm of prostate: Secondary | ICD-10-CM

## 2019-11-19 DIAGNOSIS — F411 Generalized anxiety disorder: Secondary | ICD-10-CM

## 2019-11-19 DIAGNOSIS — M1A9XX0 Chronic gout, unspecified, without tophus (tophi): Secondary | ICD-10-CM

## 2019-11-19 DIAGNOSIS — F17200 Nicotine dependence, unspecified, uncomplicated: Secondary | ICD-10-CM

## 2019-11-19 MED ORDER — METOPROLOL SUCCINATE ER 50 MG PO TB24: 50.0000 mg | ORAL_TABLET | Freq: Every day | ORAL | 0 refills | Status: DC

## 2019-11-19 NOTE — Progress Notes (Signed)
Virtual Visit via Video Note  I connected with pt on 11/19/19 at  2:30 PM EST by a video enabled telemedicine application and verified that I am speaking with the correct person using two identifiers.  Location patient: home Location provider:work or home office Persons participating in the virtual visit: patient, provider  I discussed the limitations of evaluation and management by telemedicine and the availability of in person appointments. The patient expressed understanding and agreed to proceed.  Telemedicine visit is a necessity given the COVID-19 restrictions in place at the current time.  HPI: 63 y/o WM being seen today for f/u HTN, anxiety, and tobacco dependence.  Feeling well, no complaints.  Anxiety: takes 1-2 0.5mg  alpraz bid long term and this has been helpful and w/out adverse side effect. PMP AWARE reviewed today: most recent alpraz rx filled 09/19/19, #360, rx by me, no red flags.  HTN: no home bp monitoring.  Tobacco: still smoking approx 10 cigs a day avg. Continues to try to cut back.  ROS: no CP, no SOB, no wheezing, no cough, no dizziness, no HAs, no rashes, no melena/hematochezia.  No polyuria or polydipsia.  No myalgias or arthralgias.  Past Medical History:  Diagnosis Date  . Allergic rhinitis   . Anal condylomata   . Anxiety   . GERD (gastroesophageal reflux disease)   . Gout   . HTN (hypertension)   . Hyperlipidemia 2017   Improved with TLC 11/2016  . IFG (impaired fasting glucose) 2016   A1c 5.2% 05/2015  . Palpable abdominal aorta 06/28/2011   Aortic u/s 07/03/11 showed NO AAA.  . Tobacco dependence    Trial of chantix 07/2017.  Ongoing, although much less, as of 07/2018.    Past Surgical History:  Procedure Laterality Date  . anal wart excision    . ESOPHAGOGASTRODUODENOSCOPY  approx mid 1990s   pt reports normal.  . SKIN CANCER EXCISION  05/2011   SSC from left side of face/neck    Family History  Problem Relation Age of Onset  .  Hypertension Mother   . Cancer Father 40       lung cancer  . Hypertension Father   . Hypertension Paternal Grandfather     SOCIAL HX:  Social History   Socioeconomic History  . Marital status: Married    Spouse name: Tammy  . Number of children: 1  . Years of education: Not on file  . Highest education level: Not on file  Occupational History  . Not on file  Social Needs  . Financial resource strain: Not on file  . Food insecurity    Worry: Not on file    Inability: Not on file  . Transportation needs    Medical: Not on file    Non-medical: Not on file  Tobacco Use  . Smoking status: Current Every Day Smoker    Packs/day: 0.50    Types: Cigarettes  . Smokeless tobacco: Never Used  Substance and Sexual Activity  . Alcohol use: Yes    Comment: beer 2-3 nights week  . Drug use: No  . Sexual activity: Not on file  Lifestyle  . Physical activity    Days per week: Not on file    Minutes per session: Not on file  . Stress: Not on file  Relationships  . Social Herbalist on phone: Not on file    Gets together: Not on file    Attends religious service: Not on file  Active member of club or organization: Not on file    Attends meetings of clubs or organizations: Not on file    Relationship status: Not on file  Other Topics Concern  . Not on file  Social History Narrative   Married, one daughter who is a Research officer, political party.   Has lived his entire life in [place], [place].   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   Ongoing tobacco dependence.    Drinks 3-4 beers several nights per week.  No hx of alc abuse or drug use.   No exercise.     Current Outpatient Medications:  .  ALPRAZolam (XANAX) 0.5 MG tablet, TAKE 1-2 TABLET BY MOUTH TWICE A DAY AS NEEDED, Disp: 360 tablet, Rfl: 1 .  amLODipine-olmesartan (AZOR) 10-40 MG tablet, Take 1 tablet by mouth daily., Disp: 90 tablet, Rfl: 0 .  colchicine 0.6 MG tablet, 2 tabs po at  onset of gout flare, then 1 tab 2 hours later.  Then 1 tab po bid beginning the following day until no further pain, Disp: 30 tablet, Rfl: 1 .  fluticasone (FLONASE) 50 MCG/ACT nasal spray, 2 sprays in each nostril once daily for nasal allergy symptoms, Disp: 48 g, Rfl: 3 .  metoprolol succinate (TOPROL-XL) 25 MG 24 hr tablet, 1 tab po qd, Disp: 30 tablet, Rfl: 1 .  pantoprazole (PROTONIX) 40 MG tablet, TAKE 1 TABLET BY MOUTH EVERY DAY, Disp: 90 tablet, Rfl: 3  EXAM:  VITALS per patient if applicable: BP (!) 528/41 (BP Location: Left Arm, Patient Position: Sitting, Cuff Size: Normal)    GENERAL: alert, oriented, appears well and in no acute distress  HEENT: atraumatic, conjunttiva clear, no obvious abnormalities on inspection of external nose and ears  NECK: normal movements of the head and neck  LUNGS: on inspection no signs of respiratory distress, breathing rate appears normal, no obvious gross SOB, gasping or wheezing  CV: no obvious cyanosis  MS: moves all visible extremities without noticeable abnormality  PSYCH/NEURO: pleasant and cooperative, no obvious depression or anxiety, speech and thought processing grossly intact  LABS: none today  Lab Results  Component Value Date   TSH 0.87 08/03/2017   Lab Results  Component Value Date   WBC 5.2 08/06/2018   HGB 15.6 08/06/2018   HCT 46.8 08/06/2018   MCV 91.6 08/06/2018   PLT 349.0 08/06/2018   Lab Results  Component Value Date   CREATININE 1.02 02/06/2019   BUN 11 02/06/2019   NA 140 02/06/2019   K 4.3 02/06/2019   CL 105 02/06/2019   CO2 27 02/06/2019   Lab Results  Component Value Date   ALT 17 08/06/2018   AST 17 08/06/2018   ALKPHOS 82 08/06/2018   BILITOT 0.9 08/06/2018   Lab Results  Component Value Date   CHOL 191 08/06/2018   Lab Results  Component Value Date   HDL 61.50 08/06/2018   Lab Results  Component Value Date   LDLCALC 111 (H) 08/06/2018   Lab Results  Component Value Date   TRIG  91.0 08/06/2018   Lab Results  Component Value Date   CHOLHDL 3 08/06/2018   Lab Results  Component Value Date   PSA 2.25 08/06/2018   PSA 1.89 08/03/2017   PSA 1.66 06/01/2016   Lab Results  Component Value Date   HGBA1C 5.2 06/01/2015    ASSESSMENT AND PLAN:  Discussed the following assessment and plan:  1) HTN: mild poor control.  Increase toprol xl to 50mg  qd and check bp/hr daily x 2 wks and do VV to f/u in 2 wks. He'll get fasting HP labs in 7-10d so we can review them at this f/u visit.  2) GAD: The current medical regimen is effective;  continue present plan and medications.  3) Tobacco dependence: still smoking some but has cut back and continues to try.  -we discussed possible serious and likely etiologies, options for evaluation and workup, limitations of telemedicine visit vs in person visit, treatment, treatment risks and precautions. Pt prefers to treat via telemedicine empirically rather then risking or undertaking an in person visit at this moment. Patient agrees to seek prompt in person care if worsening, new symptoms arise, or if is not improving with treatment.   I discussed the assessment and treatment plan with the patient. The patient was provided an opportunity to ask questions and all were answered. The patient agreed with the plan and demonstrated an understanding of the instructions.   The patient was advised to call back or seek an in-person evaluation if the symptoms worsen or if the condition fails to improve as anticipated.  F/u: 2 wks  Signed:  Crissie Sickles, MD           11/19/2019

## 2019-11-26 ENCOUNTER — Telehealth: Payer: Self-pay | Admitting: Family Medicine

## 2019-11-26 NOTE — Telephone Encounter (Signed)
Patient advised and voiced understanding.  

## 2019-11-26 NOTE — Telephone Encounter (Signed)
Please advise, thanks.

## 2019-11-26 NOTE — Telephone Encounter (Signed)
Patient reports that he was putting a child's car seat in a car and pulled his back out.  He is asking for things he could do at home to possible help with pain,etc... Hold / cold packs, Ibuprofen etc.  Patient has a CPE on 12/16 with Dr. Anitra Lauth. Please contact patient at [phone].  Thank you

## 2019-11-26 NOTE — Telephone Encounter (Signed)
LM for pt to returncall

## 2019-11-26 NOTE — Telephone Encounter (Signed)
RESTS: don't stand for more than 1 hour w/out sitting/lying down for 15 min. Done lie down for more than 1 hour--except when sleeping--w/out getting up and walking around for 15 min. Apply heating pad to affected area 15 min 2- 3 times per day. Look on [employer] for low back stretches/strengthening demonstrations for low back strain. Take 2 aleve with food twice a day.

## 2019-11-27 ENCOUNTER — Ambulatory Visit: Payer: BC Managed Care – PPO

## 2019-12-01 ENCOUNTER — Other Ambulatory Visit: Payer: Self-pay

## 2019-12-01 ENCOUNTER — Ambulatory Visit (INDEPENDENT_AMBULATORY_CARE_PROVIDER_SITE_OTHER): Payer: BC Managed Care – PPO | Admitting: Family Medicine

## 2019-12-01 DIAGNOSIS — Z125 Encounter for screening for malignant neoplasm of prostate: Secondary | ICD-10-CM

## 2019-12-01 DIAGNOSIS — I10 Essential (primary) hypertension: Secondary | ICD-10-CM

## 2019-12-01 LAB — CBC WITH DIFFERENTIAL/PLATELET
Basophils Absolute: 0.1 10*3/uL (ref 0.0–0.1)
Basophils Relative: 0.8 % (ref 0.0–3.0)
Eosinophils Absolute: 0.1 10*3/uL (ref 0.0–0.7)
Eosinophils Relative: 2.3 % (ref 0.0–5.0)
HCT: 46.8 % (ref 39.0–52.0)
Hemoglobin: 15.7 g/dL (ref 13.0–17.0)
Lymphocytes Relative: 21.9 % (ref 12.0–46.0)
Lymphs Abs: 1.4 10*3/uL (ref 0.7–4.0)
MCHC: 33.5 g/dL (ref 30.0–36.0)
MCV: 93.1 fl (ref 78.0–100.0)
Monocytes Absolute: 0.8 10*3/uL (ref 0.1–1.0)
Monocytes Relative: 11.8 % (ref 3.0–12.0)
Neutro Abs: 4 10*3/uL (ref 1.4–7.7)
Neutrophils Relative %: 63.2 % (ref 43.0–77.0)
Platelets: 327 10*3/uL (ref 150.0–400.0)
RBC: 5.03 Mil/uL (ref 4.22–5.81)
RDW: 13.5 % (ref 11.5–15.5)
WBC: 6.4 10*3/uL (ref 4.0–10.5)

## 2019-12-01 LAB — COMPREHENSIVE METABOLIC PANEL
ALT: 18 U/L (ref 0–53)
AST: 22 U/L (ref 0–37)
Albumin: 4.3 g/dL (ref 3.5–5.2)
Alkaline Phosphatase: 94 U/L (ref 39–117)
BUN: 15 mg/dL (ref 6–23)
CO2: 29 mEq/L (ref 19–32)
Calcium: 9.2 mg/dL (ref 8.4–10.5)
Chloride: 105 mEq/L (ref 96–112)
Creatinine, Ser: 0.91 mg/dL (ref 0.40–1.50)
GFR: 84.45 mL/min (ref >60.00)
Glucose, Bld: 94 mg/dL (ref 70–99)
Potassium: 5.2 mEq/L — ABNORMAL HIGH (ref 3.5–5.1)
Sodium: 140 mEq/L (ref 135–145)
Total Bilirubin: 0.6 mg/dL (ref 0.2–1.2)
Total Protein: 6.6 g/dL (ref 6.0–8.3)

## 2019-12-01 LAB — LIPID PANEL
Cholesterol: 171 mg/dL (ref 0–200)
HDL: 66.8 mg/dL (ref >39.00)
LDL Cholesterol: 94 mg/dL (ref 0–99)
NonHDL: 104.51
Total CHOL/HDL Ratio: 3
Triglycerides: 51 mg/dL (ref 0.0–149.0)
VLDL: 10.2 mg/dL (ref 0.0–40.0)

## 2019-12-01 LAB — PSA: PSA: 2.6 ng/mL (ref 0.10–4.00)

## 2019-12-01 LAB — TSH: TSH: 1 u[IU]/mL (ref 0.35–4.50)

## 2019-12-02 ENCOUNTER — Other Ambulatory Visit: Payer: Self-pay | Admitting: Family Medicine

## 2019-12-02 DIAGNOSIS — E875 Hyperkalemia: Secondary | ICD-10-CM

## 2019-12-02 DIAGNOSIS — I10 Essential (primary) hypertension: Secondary | ICD-10-CM

## 2019-12-03 ENCOUNTER — Ambulatory Visit: Payer: BC Managed Care – PPO | Admitting: Family Medicine

## 2019-12-03 ENCOUNTER — Other Ambulatory Visit: Payer: Self-pay

## 2019-12-04 ENCOUNTER — Ambulatory Visit (INDEPENDENT_AMBULATORY_CARE_PROVIDER_SITE_OTHER): Payer: BC Managed Care – PPO | Admitting: Family Medicine

## 2019-12-04 ENCOUNTER — Encounter: Payer: Self-pay | Admitting: Family Medicine

## 2019-12-04 VITALS — BP 125/65 | HR 65

## 2019-12-04 DIAGNOSIS — I10 Essential (primary) hypertension: Secondary | ICD-10-CM | POA: Diagnosis not present

## 2019-12-04 DIAGNOSIS — E875 Hyperkalemia: Secondary | ICD-10-CM | POA: Diagnosis not present

## 2019-12-04 NOTE — Progress Notes (Signed)
Virtual Visit via Video Note  I connected with Todd Garner on 12/04/19 at 11:30 AM EST by a video enabled telemedicine application and verified that I am speaking with the correct person using two identifiers.  Location patient: home Location provider:work or home office Persons participating in the virtual visit: patient, provider  I discussed the limitations of evaluation and management by telemedicine and the availability of in person appointments. The patient expressed understanding and agreed to proceed.  Telemedicine visit is a necessity given the COVID-19 restrictions in place at the current time.  HPI: 63 y/o male being seen today for 2 wk f/u uncontrolled HTN. Increased toprol xl to 50mg  qd last visit. BMET reviewed from 12/01/19--all stable except K up to 5.2.  Interim hx: Minimal orthostatic dizziness but not every time. Home bp's: 114-125 syst, 65-70 dias, HR 64-68.  Reviewed recent blood (12/01/19) work in detail, all normal except K mildly elevated at 5.2. He had been taking quite a few ibuprofen for acute back pain prior to the lab draw.     ROS: See pertinent positives and negatives per HPI.  Past Medical History:  Diagnosis Date  . Allergic rhinitis   . Anal condylomata   . Anxiety   . GERD (gastroesophageal reflux disease)   . Gout   . HTN (hypertension)   . Hyperlipidemia 2017   Improved with TLC 11/2016  . IFG (impaired fasting glucose) 2016   A1c 5.2% 05/2015  . Palpable abdominal aorta 06/28/2011   Aortic u/s 07/03/11 showed NO AAA.  . Tobacco dependence    Trial of chantix 07/2017.  Ongoing, although much less, as of 07/2018.    Past Surgical History:  Procedure Laterality Date  . anal wart excision    . ESOPHAGOGASTRODUODENOSCOPY  approx mid 1990s   Todd Garner reports normal.  . SKIN CANCER EXCISION  05/2011   SSC from left side of face/neck    Family History  Problem Relation Age of Onset  . Hypertension Mother   . Cancer Father 22       lung cancer  .  Hypertension Father   . Hypertension Paternal Grandfather     SOCIAL HX:  Social History   Socioeconomic History  . Marital status: Married    Spouse name: Tammy  . Number of children: 1  . Years of education: Not on file  . Highest education level: Not on file  Occupational History  . Not on file  Tobacco Use  . Smoking status: Current Every Day Smoker    Packs/day: 0.50    Types: Cigarettes  . Smokeless tobacco: Never Used  Substance and Sexual Activity  . Alcohol use: Yes    Comment: beer 2-3 nights week  . Drug use: No  . Sexual activity: Not on file  Other Topics Concern  . Not on file  Social History Narrative   Married, one daughter who is a Research officer, political party.   Has lived his entire life in [place], [place].   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   Ongoing tobacco dependence.    Drinks 3-4 beers several nights per week.  No hx of alc abuse or drug use.   No exercise.   Social Determinants of Health   Financial Resource Strain:   . Difficulty of Paying Living Expenses: Not on file  Food Insecurity:   . Worried About Charity fundraiser in the Last Year: Not on file  . Ran Out of Food in  the Last Year: Not on file  Transportation Needs:   . Lack of Transportation (Medical): Not on file  . Lack of Transportation (Non-Medical): Not on file  Physical Activity:   . Days of Exercise per Week: Not on file  . Minutes of Exercise per Session: Not on file  Stress:   . Feeling of Stress : Not on file  Social Connections:   . Frequency of Communication with Friends and Family: Not on file  . Frequency of Social Gatherings with Friends and Family: Not on file  . Attends Religious Services: Not on file  . Active Member of Clubs or Organizations: Not on file  . Attends Archivist Meetings: Not on file  . Marital Status: Not on file      Current Outpatient Medications:  .  ALPRAZolam (XANAX) 0.5 MG tablet, TAKE 1-2 TABLET BY  MOUTH TWICE A DAY AS NEEDED, Disp: 360 tablet, Rfl: 1 .  amLODipine-olmesartan (AZOR) 10-40 MG tablet, Take 1 tablet by mouth daily., Disp: 90 tablet, Rfl: 0 .  colchicine 0.6 MG tablet, 2 tabs po at onset of gout flare, then 1 tab 2 hours later.  Then 1 tab po bid beginning the following day until no further pain, Disp: 30 tablet, Rfl: 1 .  fluticasone (FLONASE) 50 MCG/ACT nasal spray, 2 sprays in each nostril once daily for nasal allergy symptoms, Disp: 48 g, Rfl: 3 .  metoprolol succinate (TOPROL-XL) 50 MG 24 hr tablet, Take 1 tablet (50 mg total) by mouth daily. Take with or immediately following a meal., Disp: 30 tablet, Rfl: 0 .  pantoprazole (PROTONIX) 40 MG tablet, TAKE 1 TABLET BY MOUTH EVERY DAY, Disp: 90 tablet, Rfl: 3  EXAM:   VITALS per patient if applicable: BP 213/08 (BP Location: Left Arm, Patient Position: Sitting, Cuff Size: Normal)   Pulse 65    GENERAL: alert, oriented, appears well and in no acute distress  HEENT: atraumatic, conjunttiva clear, no obvious abnormalities on inspection of external nose and ears  NECK: normal movements of the head and neck  LUNGS: on inspection no signs of respiratory distress, breathing rate appears normal, no obvious gross SOB, gasping or wheezing  CV: no obvious cyanosis  MS: moves all visible extremities without noticeable abnormality  PSYCH/NEURO: pleasant and cooperative, no obvious depression or anxiety, speech and thought processing grossly intact  LABS: none today    Chemistry      Component Value Date/Time   NA 140 12/01/2019 1014   K 5.2 No hemolysis seen (H) 12/01/2019 1014   CL 105 12/01/2019 1014   CO2 29 12/01/2019 1014   BUN 15 12/01/2019 1014   CREATININE 0.91 12/01/2019 1014   CREATININE 1.02 02/06/2019 1602      Component Value Date/Time   CALCIUM 9.2 12/01/2019 1014   ALKPHOS 94 12/01/2019 1014   AST 22 12/01/2019 1014   ALT 18 12/01/2019 1014   BILITOT 0.6 12/01/2019 1014      ASSESSMENT AND  PLAN:  Discussed the following assessment and plan:  1) HTN: well controlled. Tolerating toprol well, expect his brief orthostatic dizziness to gradually resolve. No med changes.  2) Mild hyperkalemia: ?NSAIDs.   No change in med at this time. Low K diet discussed today and will mail him low K dietary handout. Plan recheck BMET 10-14d.  -we discussed possible serious and likely etiologies, options for evaluation and workup, limitations of telemedicine visit vs in person visit, treatment, treatment risks and precautions. Todd Garner prefers to treat  via telemedicine empirically rather then risking or undertaking an in person visit at this moment. Patient agrees to seek prompt in person care if worsening, new symptoms arise, or if is not improving with treatment.   I discussed the assessment and treatment plan with the patient. The patient was provided an opportunity to ask questions and all were answered. The patient agreed with the plan and demonstrated an understanding of the instructions.   The patient was advised to call back or seek an in-person evaluation if the symptoms worsen or if the condition fails to improve as anticipated.  F/u: 6 mo CPE  Signed:  Crissie Sickles, MD           12/04/2019

## 2019-12-13 ENCOUNTER — Other Ambulatory Visit: Payer: Self-pay | Admitting: Family Medicine

## 2020-01-03 ENCOUNTER — Other Ambulatory Visit: Payer: Self-pay | Admitting: Family Medicine

## 2020-01-07 ENCOUNTER — Other Ambulatory Visit: Payer: Self-pay | Admitting: Family Medicine

## 2020-02-03 ENCOUNTER — Other Ambulatory Visit: Payer: Self-pay | Admitting: Family Medicine

## 2020-02-03 DIAGNOSIS — I10 Essential (primary) hypertension: Secondary | ICD-10-CM

## 2020-03-09 DIAGNOSIS — L858 Other specified epidermal thickening: Secondary | ICD-10-CM | POA: Diagnosis not present

## 2020-03-09 DIAGNOSIS — L853 Xerosis cutis: Secondary | ICD-10-CM | POA: Diagnosis not present

## 2020-03-09 DIAGNOSIS — L570 Actinic keratosis: Secondary | ICD-10-CM | POA: Diagnosis not present

## 2020-03-09 DIAGNOSIS — D485 Neoplasm of uncertain behavior of skin: Secondary | ICD-10-CM | POA: Diagnosis not present

## 2020-03-09 DIAGNOSIS — L821 Other seborrheic keratosis: Secondary | ICD-10-CM | POA: Diagnosis not present

## 2020-03-09 DIAGNOSIS — D225 Melanocytic nevi of trunk: Secondary | ICD-10-CM | POA: Diagnosis not present

## 2020-03-09 DIAGNOSIS — Z85828 Personal history of other malignant neoplasm of skin: Secondary | ICD-10-CM | POA: Diagnosis not present

## 2020-03-10 DIAGNOSIS — M5411 Radiculopathy, occipito-atlanto-axial region: Secondary | ICD-10-CM | POA: Diagnosis not present

## 2020-03-10 DIAGNOSIS — M9901 Segmental and somatic dysfunction of cervical region: Secondary | ICD-10-CM | POA: Diagnosis not present

## 2020-03-11 ENCOUNTER — Ambulatory Visit: Payer: BC Managed Care – PPO | Attending: Internal Medicine

## 2020-03-11 DIAGNOSIS — Z23 Encounter for immunization: Secondary | ICD-10-CM

## 2020-03-11 NOTE — Progress Notes (Signed)
   Covid-19 Vaccination Clinic  Name:  Todd Garner    MRN: [id number] DOB: January 30, 1956  03/11/2020  Todd Garner was observed post Covid-19 immunization for 15 minutes without incident. He was provided with Vaccine Information Sheet and instruction to access the V-Safe system.   Todd Garner was instructed to call 911 with any severe reactions post vaccine: Marland Kitchen Difficulty breathing  . Swelling of face and throat  . A fast heartbeat  . A bad rash all over body  . Dizziness and weakness   Immunizations Administered    Name Date Dose VIS Date Route   Pfizer COVID-19 Vaccine 03/11/2020  2:18 PM 0.3 mL 11/28/2019 Intramuscular   Manufacturer: [place]   Lot: [id number]   [place]: [address]-7

## 2020-03-17 DIAGNOSIS — M9901 Segmental and somatic dysfunction of cervical region: Secondary | ICD-10-CM | POA: Diagnosis not present

## 2020-03-17 DIAGNOSIS — M5411 Radiculopathy, occipito-atlanto-axial region: Secondary | ICD-10-CM | POA: Diagnosis not present

## 2020-03-24 DIAGNOSIS — M9901 Segmental and somatic dysfunction of cervical region: Secondary | ICD-10-CM | POA: Diagnosis not present

## 2020-03-24 DIAGNOSIS — M5411 Radiculopathy, occipito-atlanto-axial region: Secondary | ICD-10-CM | POA: Diagnosis not present

## 2020-03-31 DIAGNOSIS — M5411 Radiculopathy, occipito-atlanto-axial region: Secondary | ICD-10-CM | POA: Diagnosis not present

## 2020-03-31 DIAGNOSIS — M9901 Segmental and somatic dysfunction of cervical region: Secondary | ICD-10-CM | POA: Diagnosis not present

## 2020-04-05 ENCOUNTER — Ambulatory Visit: Payer: BC Managed Care – PPO | Attending: Internal Medicine

## 2020-04-05 DIAGNOSIS — Z23 Encounter for immunization: Secondary | ICD-10-CM

## 2020-04-05 NOTE — Progress Notes (Signed)
   Covid-19 Vaccination Clinic  Name:  Todd Garner    MRN: [id number] DOB: March 31, 1956  04/05/2020  Mr. Todd Garner was observed post Covid-19 immunization for 15 minutes without incident. He was provided with Vaccine Information Sheet and instruction to access the V-Safe system.   Mr. Todd Garner was instructed to call 911 with any severe reactions post vaccine: Marland Kitchen Difficulty breathing  . Swelling of face and throat  . A fast heartbeat  . A bad rash all over body  . Dizziness and weakness   Immunizations Administered    Name Date Dose VIS Date Route   Pfizer COVID-19 Vaccine 04/05/2020 12:07 PM 0.3 mL 02/11/2019 Intramuscular   Manufacturer: [place]   Lot: [id number]   [place]: [id number]

## 2020-04-07 DIAGNOSIS — M9903 Segmental and somatic dysfunction of lumbar region: Secondary | ICD-10-CM | POA: Diagnosis not present

## 2020-04-21 ENCOUNTER — Ambulatory Visit: Payer: BC Managed Care – PPO | Admitting: Family Medicine

## 2020-04-21 ENCOUNTER — Other Ambulatory Visit: Payer: Self-pay

## 2020-04-21 ENCOUNTER — Encounter: Payer: Self-pay | Admitting: Family Medicine

## 2020-04-21 ENCOUNTER — Ambulatory Visit: Payer: Self-pay

## 2020-04-21 DIAGNOSIS — M545 Low back pain, unspecified: Secondary | ICD-10-CM

## 2020-04-21 MED ORDER — BACLOFEN 10 MG PO TABS: 5.0000 mg | ORAL_TABLET | Freq: Three times a day (TID) | ORAL | 3 refills | Status: AC | PRN

## 2020-04-21 MED ORDER — HYDROCODONE-ACETAMINOPHEN 5-325 MG PO TABS: 1.0000 | ORAL_TABLET | Freq: Every evening | ORAL | 0 refills | Status: DC | PRN

## 2020-04-21 MED ORDER — PREDNISONE 10 MG PO TABS: ORAL_TABLET | ORAL | 0 refills | Status: AC
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
## 2020-04-26 ENCOUNTER — Other Ambulatory Visit: Payer: Self-pay | Admitting: Family Medicine

## 2020-04-26 DIAGNOSIS — I10 Essential (primary) hypertension: Secondary | ICD-10-CM

## 2020-04-28 ENCOUNTER — Other Ambulatory Visit: Payer: Self-pay | Admitting: Family Medicine

## 2020-04-28 NOTE — Telephone Encounter (Signed)
Requesting:alprazolam Contract:02/06/19 UDS:n/a Last Visit:12/04/19 Next Visit:advised to f/u 13mo. CPE Last Refill:04/02/19(360,1)  Please Advise. Medication pending

## 2020-05-18 ENCOUNTER — Ambulatory Visit: Payer: BC Managed Care – PPO | Admitting: Family Medicine

## 2020-05-18 ENCOUNTER — Other Ambulatory Visit: Payer: Self-pay

## 2020-05-18 ENCOUNTER — Ambulatory Visit: Payer: Self-pay

## 2020-05-18 ENCOUNTER — Encounter: Payer: Self-pay | Admitting: Family Medicine

## 2020-05-18 DIAGNOSIS — M545 Low back pain, unspecified: Secondary | ICD-10-CM

## 2020-05-18 DIAGNOSIS — C801 Malignant (primary) neoplasm, unspecified: Secondary | ICD-10-CM

## 2020-05-18 HISTORY — DX: Malignant (primary) neoplasm, unspecified: C80.1

## 2020-05-18 NOTE — Progress Notes (Signed)
I saw and examined the patient with Dr. Mayer Masker and agree with assessment and plan as outlined.    Ongoing pain at left SI joint.  Will inject with cortisone today, resume chiropractic next week.  MRI if fails to improve.

## 2020-05-18 NOTE — Progress Notes (Signed)
Todd Garner - 64 y.o. male MRN [id number]  Date of birth: Apr 29, 1956  Office Visit Note: Visit Date: 05/18/2020 PCP: Tammi Sou, MD Referred by: Tammi Sou, MD  Subjective: Chief Complaint  Patient presents with   Left Hip - Pain   HPI: Todd Garner is a 64 y.o. male with a hx of L hip pain previously treated with prednisone and baclofen who comes in today with continued L hip pain and tingling that radiates down to his L gluteal region. He says he feels like he's loosing muscle in his gluteal area because he cannot use it. He can only sleep on his L side with a pillow between his legs. Sitting isn't too bad, but when he's standing he feels it. He endorses worsening pain when he flexes at the hips, but doesn't have any pain with extension at the hips. Denies any point tenderness along the spine. He feels like he's had the pain long enough that he's noticed himself starting to compensate on the R side. He's now having some tenderness along the R hip with certain movements as well. He's gone to his chiropractor 6-8 times since the winter, but his treatments haven't seemed to help, so he instructed Todd Garner to come in to sports medicine.    ROS Otherwise per HPI.  Assessment & Plan: Visit Diagnoses:  1. Acute left-sided low back pain without sciatica     Plan: SI joint dysfunction with limited mobility on exam- elected for corticosteroid injection with some pain relief during anesthetic phase. If pain improves, recommend that he return to Dr. Owens Shark for further chiropractic work. If pain persists, will obtain an MRI.    Orders Placed This Encounter  Procedures   US Guided Needle Placement    Follow-up: No follow-ups on file.   Procedures: No procedures performed  No notes on file   Clinical History: No specialty comments available.   He reports that he has been smoking cigarettes. He has been smoking about 0.50 packs per day. He has never used smokeless  tobacco. No results for input(s): HGBA1C, LABURIC in the last 8760 hours.  Objective:  VS:  HT:     WT:    BMI:      BP:    HR: bpm   TEMP: ( )   RESP:  Physical Exam  PHYSICAL EXAM: Gen: NAD, alert, cooperative with exam, well-appearing HEENT: clear conjunctiva,  CV:  no edema, capillary refill brisk, normal rate Resp: non-labored Skin: no rashes, normal turgor  Neuro: no gross deficits.  Psych:  alert and oriented  Ortho Exam  Lumbar spine: - Inspection: L SI joint and sacrum protrude out compared to right - Palpation:TTP over L SI joint. No TTP over spinous processes - ROM: limited flexion due to pain - Strength: 5/5 strength of lower extremity in L4-S1 nerve root distributions b/l; normal gait - Neuro: sensation intact in the L4-S1 nerve root distribution b/l, 2+ L4 and S1 reflexes - Special testing: Negative straight leg raise, negative slump, negative Stork test, Negative FABER Pelvic compression test- L SI joint locked up, R is hypermobile  Imaging: US Guided Needle Placement  Result Date: 05/18/2020 Ultrasound-guided left SI injection: After sterile prep with Betadine, injected 5 cc 1% lidocaine without epinephrine and 40 mg methylprednisolone using a 22-gauge spinal needle, passing the needle through the sacroiliac ligament into the region of the SI joint.  Modest immediate relief.     Past Medical/Family/Surgical/Social History: Medications & Allergies  reviewed per EMR, new medications updated. Patient Active Problem List   Diagnosis Date Noted   Health maintenance examination 04/28/2014   GERD (gastroesophageal reflux disease) 07/22/2013   Gout 07/22/2013   Allergic rhinitis 07/22/2013   Situational anxiety 07/22/2013   Tobacco dependence 09/16/2011   Colon cancer screening 09/16/2011   HTN (hypertension), benign 06/28/2011   Past Medical History:  Diagnosis Date   Allergic rhinitis    Anal condylomata    Anxiety    GERD (gastroesophageal reflux  disease)    Gout    HTN (hypertension)    Hyperlipidemia 2017   Improved with TLC 11/2016   IFG (impaired fasting glucose) 2016   A1c 5.2% 05/2015   Palpable abdominal aorta 06/28/2011   Aortic u/s 07/03/11 showed NO AAA.   Tobacco dependence    Trial of chantix 07/2017.  Ongoing, although much less, as of 07/2018.   Family History  Problem Relation Age of Onset   Hypertension Mother    Cancer Father 82       lung cancer   Hypertension Father    Hypertension Paternal Grandfather    Past Surgical History:  Procedure Laterality Date   anal wart excision     ESOPHAGOGASTRODUODENOSCOPY  approx mid 1990s   pt reports normal.   SKIN CANCER EXCISION  05/2011   SSC from left side of face/neck   Social History   Occupational History   Not on file  Tobacco Use   Smoking status: Current Every Day Smoker    Packs/day: 0.50    Types: Cigarettes   Smokeless tobacco: Never Used  Substance and Sexual Activity   Alcohol use: Yes    Comment: beer 2-3 nights week   Drug use: No   Sexual activity: Not on file

## 2020-06-02 ENCOUNTER — Telehealth: Payer: Self-pay | Admitting: Family Medicine

## 2020-06-02 NOTE — Telephone Encounter (Signed)
Patient called asked if he can go for (PT) in [place]. The name of the facility is [facility]. The number to contact patient is [phone]

## 2020-06-02 NOTE — Telephone Encounter (Signed)
Ok

## 2020-06-07 NOTE — Progress Notes (Signed)
OFFICE VISIT  06/08/2020   CC:  Chief Complaint  Patient presents with  . Back Pain    HPI:    Patient is a 64 y.o. male who presents for back pain. Dr. Owens Shark, a chiropractor in the area has been seeing Todd Garner recently and with his eval and treatments he was becoming more convinced that his pain was not something chiropracty was appropriate for.  He referred Todd Garner to Dr. Junius Roads, sports med, but Dr. Saul Fordyce recollection of things sounds like nothing new came of things. I then got a call a few days ago from Dr. Owens Shark stating he was worried b/c of Todd Garner's ongoing severe back pain AND his recent problem with undesired weight loss. He is worried that Todd Garner's back pain may be coming from a malignancy, primary vs mets. We then called Todd Garner and asked him to come in for evaluation and that is what he's here for today.  In review of EMR there is a L/S plain film 04/21/20 by Dr. Junius Roads, with his comments being "He has diffuse degenerative disc disease, most pronounced at L5-S1. He  has lumbar facet DJD at multiple levels. Bilateral SI joint DJD. No sign  of compression fracture or neoplasm." Then on 05/18/20 there is documentation that Dr. Junius Roads did an ultrasound-guided left SI steroid injection.  CURRENTLY: To start all this off her recalls twisting his back putting a car seat in car about 5-6 mo ago. Then pulled L LB again mowing a lawn early spring this year. Saw chiropract, nothing helping, referred to Dr. Junius Roads and rx'd muscle relaxers and pain pills and prednisone.  No help.  A week later he got SI joint shot by Dr. Junius Roads, helped briefly but pain returned the same.  Pt says the pain is L LB/upper glut with radiation down L leg to about the knee level.   He and his wife saw a focal, subtle soft tissue swelling/protrusion in L LB where his primary pain is located shortly after onset of back pain.  This was present PRIOR to getting an injection in his back. Other body pains: R hip anterior aspect, bilat thighs  "sore".  Got bit by tick 6 wks ago or so.  No rash.  He and wife seem a bit focused on this as being a pertinent piece of info and are strongly desiring lyme dz testing. No signif alc intake. Pt does smoke cigs. No hx of colon ca screening: I referred him to GI for screening colonoscopy 2018 and 2019 but it appears pt never attempted to arrange this procedure.  ROS: small knot palpable on R side of neck lately, no fevers, no night sweats, no CP, no SOB, no wheezing, no cough. Roving HAs+.  no rashes, no melena/hematochezia. Voice less projected. No polyuria or polydipsia.  Thighs ache.  +Malaise.  No focal weakness, paresthesias, or tremors.  No acute vision or hearing abnormalities. No n/v/d or abd pain.  No palpitations.   Approx 18 of dec appetite.      Past Medical History:  Diagnosis Date  . Allergic rhinitis   . Anal condylomata   . Anxiety   . GERD (gastroesophageal reflux disease)   . Gout   . HTN (hypertension)   . Hyperlipidemia 2017   Improved with TLC 11/2016  . IFG (impaired fasting glucose) 2016   A1c 5.2% 05/2015  . Palpable abdominal aorta 06/28/2011   Aortic u/s 07/03/11 showed NO AAA.  . Tobacco dependence    Trial of chantix 07/2017.  Ongoing, although much less, as of 07/2018.    Past Surgical History:  Procedure Laterality Date  . anal wart excision    . ESOPHAGOGASTRODUODENOSCOPY  approx mid 1990s   pt reports normal.  . SKIN CANCER EXCISION  05/2011   SSC from left side of face/neck    Outpatient Medications Prior to Visit  Medication Sig Dispense Refill  . ALPRAZolam (XANAX) 0.5 MG tablet TAKE 1-2 TABLETS BY MOUTH TWICE A DAY AS NEEDED 360 tablet 1  . amLODipine-olmesartan (AZOR) 10-40 MG tablet TAKE 1 TABLET BY MOUTH EVERY DAY 90 tablet 0  . fluticasone (FLONASE) 50 MCG/ACT nasal spray 2 sprays in each nostril once daily for nasal allergy symptoms 48 g 3  . HYDROcodone-acetaminophen (NORCO/VICODIN) 5-325 MG tablet Take 1-2 tablets by mouth at bedtime as  needed for moderate pain. 20 tablet 0  . metoprolol succinate (TOPROL-XL) 50 MG 24 hr tablet TAKE 1 TABLET (50 MG TOTAL) BY MOUTH DAILY. TAKE WITH OR IMMEDIATELY FOLLOWING A MEAL. 90 tablet 1  . pantoprazole (PROTONIX) 40 MG tablet TAKE 1 TABLET BY MOUTH EVERY DAY 90 tablet 1  . baclofen (LIORESAL) 10 MG tablet Take 0.5-1 tablets (5-10 mg total) by mouth 3 (three) times daily as needed for muscle spasms. (Patient not taking: Reported on 06/08/2020) 30 each 3  . colchicine 0.6 MG tablet 2 tabs po at onset of gout flare, then 1 tab 2 hours later.  Then 1 tab po bid beginning the following day until no further pain (Patient not taking: Reported on 06/08/2020) 30 tablet 1  . predniSONE (DELTASONE) 10 MG tablet Take as directed for 12 days.  Daily dose 6,6,5,5,4,4,3,3,2,2,1,1. (Patient not taking: Reported on 06/08/2020) 42 tablet 0   No facility-administered medications prior to visit.    Allergies  Allergen Reactions  . Codeine     ROS As per HPI  PE: Vitals with BMI 06/08/2020 12/04/2019 11/19/2019  Height - - -  Weight 136 lbs 6 oz - -  BMI - - -  Systolic 462 703 500  Diastolic 72 65 81  Pulse 93 65 -  O2 sat on RA today is 98%  Gen: alert, pleasant, thin/gaunt-appearing, tired-appearing.   Lucid thought and speech. XFG:HWEX: no injection, icteris, swelling, or exudate.  EOMI, PERRLA. Mouth: lips without lesion/swelling.  Oral mucosa pink and moist. Oropharynx without erythema, exudate, or swelling. Tongue has black substance coating it. Neck: ROM normal.  Nontender.  He has a 2cm x 2 cm rubbery, mobile, non-tender lymph node palpable along the edge of R SCM muscle. No supraclavicular nodules, no axillary nodules, no groin nodules. CV: RRR, no m/r/g.   LUNGS: CTA bilat, nonlabored resps, good aeration in all lung fields. ABD: soft, nontender, nondistended.  BS nl.  No bruit or palpable mass.  Cannot palpate spleen.  His liver edge is palpable 4 finger-breadths below R costal margin, no  tenderness.   Back: ROM shows limitation to flexion to about 50%, extension, rotation, and lateral bending intact.  He has a mildly tender fist-sized soft tissue mass in L LB/L upper glut intersection.  No overlying skin changes. Left hip non-tender, ROM intact.   R hip with painful ROM in all ranges, +anterior TTP R hip. R leg prox weakness suspected (3/5) but o/w strength intact in R leg.  Left leg strngth 5/5 prox/dist bilat.  Patellar DTRs 2+ bilat.  Cannot elicit achilles DTR on either side.  No clonus.  Babinsky elicits no up or downgoing motion or splaying  of toes on either side. Sensation not tested today. He walks slowly, with obvious limp favoring R hip.   Skin - no sores or suspicious lesions or rashes or color changes Rectal exam: tone normal,without mass, lesions or tenderness, PROSTATE EXAM: smooth and without nodules or tenderness.  Very subtle asymmetry-->R side with very mild increase in size compared to L.  No induration, no fluctuance, no tenderness.  LABS:  Lab Results  Component Value Date   TSH 1.00 12/01/2019   Lab Results  Component Value Date   WBC 6.4 12/01/2019   HGB 15.7 12/01/2019   HCT 46.8 12/01/2019   MCV 93.1 12/01/2019   PLT 327.0 12/01/2019   Lab Results  Component Value Date   CREATININE 0.91 12/01/2019   BUN 15 12/01/2019   NA 140 12/01/2019   K 5.2 No hemolysis seen (H) 12/01/2019   CL 105 12/01/2019   CO2 29 12/01/2019   Lab Results  Component Value Date   ALT 18 12/01/2019   AST 22 12/01/2019   ALKPHOS 94 12/01/2019   BILITOT 0.6 12/01/2019   Lab Results  Component Value Date   CHOL 171 12/01/2019   Lab Results  Component Value Date   HDL 66.80 12/01/2019   Lab Results  Component Value Date   LDLCALC 94 12/01/2019   Lab Results  Component Value Date   TRIG 51.0 12/01/2019   Lab Results  Component Value Date   CHOLHDL 3 12/01/2019   Lab Results  Component Value Date   PSA 2.60 12/01/2019   PSA 2.25 08/06/2018   PSA  1.89 08/03/2017   Lab Results  Component Value Date   HGBA1C 5.2 06/01/2015    IMPRESSION AND PLAN:   Subacute L LB pain rad down L leg to level of knee, with abnormal wt loss (approx 35 lbs in the last 4 mo), marked hepatomegaly on exam, L LB soft tissue mass, R prox leg weakness and anterior R hip tenderness, R sided neck LN. Hx of tick bite in groin at least 6-8 wks AFTER onset of all his sx's. Long time smoker. Worried about malignancy--primary lung with mets to bone and liver?  Lymphoma and multiple myeloma possibilities as well. Low suspicion that lyme dz has anything to do with this but will check lyme blood test per pt/wife request.  CBC w/diff, CMET, TSH, lipase, ESR, CRP, PSA, lyme ab with reflex WB. I called a radiologist today and discussed best initial option regarding imaging and decided on CT of C/A/P with and without contrast.   Use info from labs and this imaging to determine any further imaging or blood testing (? MRI L spine, ?soft tissue u/s of R LB soft tissue mass, ? SPEP/UPEP, ? Bone scan, hemoccults?). Discussed plan in detail with pt and his wife and they expressed understanding.  An After Visit Summary was printed and given to the patient.  FOLLOW UP: No follow-ups on file.  Signed:  Crissie Sickles, MD           06/08/2020

## 2020-06-08 ENCOUNTER — Encounter: Payer: Self-pay | Admitting: Family Medicine

## 2020-06-08 ENCOUNTER — Other Ambulatory Visit: Payer: Self-pay

## 2020-06-08 ENCOUNTER — Ambulatory Visit: Payer: BC Managed Care – PPO | Admitting: Family Medicine

## 2020-06-08 VITALS — BP 124/72 | HR 93 | Temp 98.9°F | Resp 16 | Wt 136.4 lb

## 2020-06-08 DIAGNOSIS — F17200 Nicotine dependence, unspecified, uncomplicated: Secondary | ICD-10-CM

## 2020-06-08 DIAGNOSIS — M7989 Other specified soft tissue disorders: Secondary | ICD-10-CM

## 2020-06-08 DIAGNOSIS — M545 Low back pain, unspecified: Secondary | ICD-10-CM

## 2020-06-08 DIAGNOSIS — S30861A Insect bite (nonvenomous) of abdominal wall, initial encounter: Secondary | ICD-10-CM

## 2020-06-08 DIAGNOSIS — W57XXXA Bitten or stung by nonvenomous insect and other nonvenomous arthropods, initial encounter: Secondary | ICD-10-CM

## 2020-06-08 DIAGNOSIS — M25552 Pain in left hip: Secondary | ICD-10-CM

## 2020-06-08 DIAGNOSIS — R160 Hepatomegaly, not elsewhere classified: Secondary | ICD-10-CM

## 2020-06-08 DIAGNOSIS — R634 Abnormal weight loss: Secondary | ICD-10-CM

## 2020-06-08 DIAGNOSIS — R29898 Other symptoms and signs involving the musculoskeletal system: Secondary | ICD-10-CM

## 2020-06-08 LAB — CBC WITH DIFFERENTIAL/PLATELET
Basophils Absolute: 0.1 10*3/uL (ref 0.0–0.1)
Basophils Relative: 0.8 % (ref 0.0–3.0)
Eosinophils Absolute: 0.1 10*3/uL (ref 0.0–0.7)
Eosinophils Relative: 0.6 % (ref 0.0–5.0)
HCT: 44.5 % (ref 39.0–52.0)
Hemoglobin: 14.8 g/dL (ref 13.0–17.0)
Lymphocytes Relative: 5.6 % — ABNORMAL LOW (ref 12.0–46.0)
Lymphs Abs: 0.5 10*3/uL — ABNORMAL LOW (ref 0.7–4.0)
MCHC: 33.2 g/dL (ref 30.0–36.0)
MCV: 88.7 fl (ref 78.0–100.0)
Monocytes Absolute: 1 10*3/uL (ref 0.1–1.0)
Monocytes Relative: 11.7 % (ref 3.0–12.0)
Neutro Abs: 6.7 10*3/uL (ref 1.4–7.7)
Neutrophils Relative %: 81.3 % — ABNORMAL HIGH (ref 43.0–77.0)
Platelets: 399 10*3/uL (ref 150.0–400.0)
RBC: 5.02 Mil/uL (ref 4.22–5.81)
RDW: 13.3 % (ref 11.5–15.5)
WBC: 8.3 10*3/uL (ref 4.0–10.5)

## 2020-06-08 LAB — COMPREHENSIVE METABOLIC PANEL
ALT: 18 U/L (ref 0–53)
AST: 65 U/L — ABNORMAL HIGH (ref 0–37)
Albumin: 3.8 g/dL (ref 3.5–5.2)
Alkaline Phosphatase: 330 U/L — ABNORMAL HIGH (ref 39–117)
BUN: 18 mg/dL (ref 6–23)
CO2: 29 mEq/L (ref 19–32)
Calcium: 10.9 mg/dL — ABNORMAL HIGH (ref 8.4–10.5)
Chloride: 94 mEq/L — ABNORMAL LOW (ref 96–112)
Creatinine, Ser: 0.72 mg/dL (ref 0.40–1.50)
GFR: 110.47 mL/min (ref >60.00)
Glucose, Bld: 96 mg/dL (ref 70–99)
Potassium: 5.2 mEq/L — ABNORMAL HIGH (ref 3.5–5.1)
Sodium: 133 mEq/L — ABNORMAL LOW (ref 135–145)
Total Bilirubin: 0.9 mg/dL (ref 0.2–1.2)
Total Protein: 6.4 g/dL (ref 6.0–8.3)

## 2020-06-08 LAB — PSA: PSA: 1.44 ng/mL (ref 0.10–4.00)

## 2020-06-08 LAB — C-REACTIVE PROTEIN: CRP: 10.2 mg/dL (ref 0.5–20.0)

## 2020-06-08 LAB — TSH: TSH: 1.56 u[IU]/mL (ref 0.35–4.50)

## 2020-06-08 LAB — SEDIMENTATION RATE: Sed Rate: 23 mm/hr — ABNORMAL HIGH (ref 0–20)

## 2020-06-08 LAB — LIPASE: Lipase: 17 U/L (ref 11.0–59.0)

## 2020-06-09 ENCOUNTER — Other Ambulatory Visit: Payer: Self-pay

## 2020-06-10 ENCOUNTER — Other Ambulatory Visit: Payer: Self-pay | Admitting: Family Medicine

## 2020-06-10 ENCOUNTER — Other Ambulatory Visit: Payer: Self-pay

## 2020-06-10 LAB — LYME AB/WESTERN BLOT REFLEX
LYME DISEASE AB, QUANT, IGM: <0.8 index (ref 0.00–0.79)
Lyme IgG/IgM Ab: <0.91 {ISR} (ref 0.00–0.90)

## 2020-06-11 ENCOUNTER — Other Ambulatory Visit: Payer: Self-pay

## 2020-06-11 DIAGNOSIS — M25552 Pain in left hip: Secondary | ICD-10-CM

## 2020-06-11 DIAGNOSIS — M545 Low back pain, unspecified: Secondary | ICD-10-CM

## 2020-06-11 DIAGNOSIS — M7989 Other specified soft tissue disorders: Secondary | ICD-10-CM

## 2020-06-11 DIAGNOSIS — R634 Abnormal weight loss: Secondary | ICD-10-CM

## 2020-06-11 DIAGNOSIS — R29898 Other symptoms and signs involving the musculoskeletal system: Secondary | ICD-10-CM

## 2020-06-11 DIAGNOSIS — F17200 Nicotine dependence, unspecified, uncomplicated: Secondary | ICD-10-CM

## 2020-06-11 DIAGNOSIS — R160 Hepatomegaly, not elsewhere classified: Secondary | ICD-10-CM

## 2020-06-15 ENCOUNTER — Telehealth: Payer: Self-pay | Admitting: Family Medicine

## 2020-06-15 NOTE — Telephone Encounter (Signed)
Spoke with patient - explained that his insurance required notes and that is the reason it took a few days to get the authorization.  He already has an appt set up with [place] - he will call today or tomorrow to see if he can get an earlier appt.  If not, he will call me back so I can get him in at [facility] or [place].

## 2020-06-17 ENCOUNTER — Telehealth: Payer: Self-pay

## 2020-06-17 ENCOUNTER — Other Ambulatory Visit: Payer: Self-pay

## 2020-06-17 ENCOUNTER — Ambulatory Visit
Admission: RE | Admit: 2020-06-17 | Discharge: 2020-06-17 | Disposition: A | Discharge status: Home / Self Care | Payer: BC Managed Care – PPO | Source: Ambulatory Visit | Attending: Family Medicine | Admitting: Family Medicine

## 2020-06-17 ENCOUNTER — Other Ambulatory Visit: Payer: Self-pay | Admitting: Family Medicine

## 2020-06-17 ENCOUNTER — Encounter: Payer: Self-pay | Admitting: Family Medicine

## 2020-06-17 DIAGNOSIS — M25552 Pain in left hip: Secondary | ICD-10-CM

## 2020-06-17 DIAGNOSIS — R634 Abnormal weight loss: Secondary | ICD-10-CM

## 2020-06-17 DIAGNOSIS — F17200 Nicotine dependence, unspecified, uncomplicated: Secondary | ICD-10-CM

## 2020-06-17 DIAGNOSIS — M7989 Other specified soft tissue disorders: Secondary | ICD-10-CM

## 2020-06-17 DIAGNOSIS — M545 Low back pain, unspecified: Secondary | ICD-10-CM

## 2020-06-17 DIAGNOSIS — R160 Hepatomegaly, not elsewhere classified: Secondary | ICD-10-CM

## 2020-06-17 DIAGNOSIS — R29898 Other symptoms and signs involving the musculoskeletal system: Secondary | ICD-10-CM

## 2020-06-17 DIAGNOSIS — C801 Malignant (primary) neoplasm, unspecified: Secondary | ICD-10-CM | POA: Diagnosis not present

## 2020-06-17 DIAGNOSIS — C7951 Secondary malignant neoplasm of bone: Secondary | ICD-10-CM

## 2020-06-17 DIAGNOSIS — C787 Secondary malignant neoplasm of liver and intrahepatic bile duct: Secondary | ICD-10-CM | POA: Diagnosis not present

## 2020-06-17 DIAGNOSIS — R918 Other nonspecific abnormal finding of lung field: Secondary | ICD-10-CM

## 2020-06-17 MED ORDER — IOPAMIDOL (ISOVUE-300) INJECTION 61%
100.0000 mL | Freq: Once | INTRAVENOUS | Status: AC | PRN
  Administered 2020-06-17: 100 mL via INTRAVENOUS

## 2020-06-17 NOTE — Telephone Encounter (Signed)
Diane at [facility] radiology called to make sure we can view the pts abd and chest CT and they wanted to make MD aware of results   Large left hilar and central left upper lobe conglomerate mass most compatible with primary lung cancer. Associated mediastinal Adenopathy.   Numerous large metastases throughout the liver measuring up to 11 Cm.  Results are viewable. Sent to [employer] and Dr Anitra Lauth

## 2020-06-17 NOTE — Telephone Encounter (Signed)
I have been in touch with patient's PCP, Dr. Anitra Lauth who is aware of CT results and will contact patient

## 2020-06-18 ENCOUNTER — Telehealth: Payer: Self-pay

## 2020-06-18 ENCOUNTER — Ambulatory Visit: Payer: BC Managed Care – PPO | Admitting: Family Medicine

## 2020-06-18 ENCOUNTER — Encounter: Payer: Self-pay | Admitting: *Deleted

## 2020-06-18 DIAGNOSIS — C7952 Secondary malignant neoplasm of bone marrow: Secondary | ICD-10-CM

## 2020-06-18 MED ORDER — OXYCODONE HCL 5 MG PO TABS: ORAL_TABLET | ORAL | 0 refills | Status: AC

## 2020-06-18 NOTE — Telephone Encounter (Signed)
Pt's wife advised referral done and Rx for pain med sent in.

## 2020-06-18 NOTE — Telephone Encounter (Signed)
I have discussed results of imaging with pt and his wife (on 06/17/20). I documented this as result note attached to CT report. Signed:  Crissie Sickles, MD           06/18/2020

## 2020-06-18 NOTE — Telephone Encounter (Signed)
Pt was scheduled for in office appt today at 4pm, appt no longer wanted. Requesting RF for pain meds, he was given Noroc 5-325mg  but would like something stronger. LF 04/21/20 #20 w/ 0 rf by Dr.Hilts.  Please advise, thanks.

## 2020-06-18 NOTE — Telephone Encounter (Signed)
OK but inform them that I did already order the oncology referral. I eRx'd oxycodone today.

## 2020-06-18 NOTE — Telephone Encounter (Signed)
Patients wife called in stating that she no longer wants the appt for today. There still processing the information. They are calling to see if he can get something for pain he was prescribed HYDROcodone-acetaminophen (NORCO/VICODIN) 5-325 MG tablet  But not by Dr. Anitra Lauth, so they are wanting to know if he will send in something for pain just a step up from that     Please call and advise

## 2020-06-18 NOTE — Progress Notes (Signed)
I received referral today on Mr. Rathbone.  I updated Dr. Julien Nordmann and Dr. Valeta Harms on this referral to get an idea about the best way patient should be seen for tissue dx.

## 2020-06-18 NOTE — Progress Notes (Signed)
Referred to rad onc for bone mets

## 2020-06-22 ENCOUNTER — Encounter: Payer: Self-pay | Admitting: *Deleted

## 2020-06-22 ENCOUNTER — Telehealth: Payer: Self-pay | Admitting: Physician Assistant

## 2020-06-22 DIAGNOSIS — R918 Other nonspecific abnormal finding of lung field: Secondary | ICD-10-CM

## 2020-06-22 NOTE — Progress Notes (Signed)
I notified scheduling to call and schedule Mr. Littlefield to be seen on 06/29/20 with [name].  This request per Dr. Julien Nordmann.

## 2020-06-22 NOTE — Telephone Encounter (Signed)
Received a new pt referral from Dr. Anitra Lauth for lung cancer. Todd Garner has been scheduled to see [name] on 7/13 at 10am w/labs at 930am. I cld and lft the appt date and time on his vm. Thoracic navigator has been updated

## 2020-06-23 NOTE — Progress Notes (Signed)
Thoracic Location of Tumor / Histology: Left upper lobe lung, left hilar, bone mets.  Patient presented with left sided low back/buttock pain  This pain has been going on for about 3 months.  CT CAP 06/17/2020: Large left hilar and central left upper lobe conglomerate mass most compatible with primary lung cancer. Associated mediastinal adenopathy.  Numerous large metastases throughout the liver measuring up to 11 cm.  Bulky porta hepatis and retroperitoneal/pelvic adenopathy.  Large bone metastases in the iliac bones bilaterally. These are associated with bone destruction and extension into the adjacent soft tissues.  Biopsies of   Tobacco/Marijuana/Snuff/ETOH use: Current smoker  Past/Anticipated interventions by cardiothoracic surgery, if any:   Past/Anticipated interventions by medical oncology, if any:  PA He[place] 06/29/2020 10 am   Signs/Symptoms  Weight changes, if any: Lost about 20 pounds  Respiratory complaints, if any: No  Hemoptysis, if any: Productive cough with clear/yellow/brown phlegm.  Pain issues, if any:  Pain mostly in his left side back/buttocks area.  Taking advil and tylenol daily.  He was prescribed oxycodone and has not started it yet.  SAFETY ISSUES:  Prior radiation? No  Pacemaker/ICD? No  Possible current pregnancy? No  Is the patient on methotrexate? No  Current Complaints / other details:   Wife is concerned because his swallowing seems to be getting worse.  She states that he is getting strangled while eating and taking his medication.

## 2020-06-24 ENCOUNTER — Other Ambulatory Visit: Payer: Self-pay

## 2020-06-24 ENCOUNTER — Other Ambulatory Visit: Payer: BC Managed Care – PPO

## 2020-06-24 ENCOUNTER — Ambulatory Visit
Admission: RE | Admit: 2020-06-24 | Discharge: 2020-06-24 | Disposition: A | Discharge status: Home / Self Care | Payer: BC Managed Care – PPO | Source: Ambulatory Visit | Attending: Radiation Oncology | Admitting: Radiation Oncology

## 2020-06-24 ENCOUNTER — Other Ambulatory Visit: Payer: Self-pay | Admitting: *Deleted

## 2020-06-24 ENCOUNTER — Encounter: Payer: Self-pay | Admitting: Radiation Oncology

## 2020-06-24 VITALS — Wt 136.6 lb

## 2020-06-24 DIAGNOSIS — C3412 Malignant neoplasm of upper lobe, left bronchus or lung: Secondary | ICD-10-CM

## 2020-06-24 DIAGNOSIS — R918 Other nonspecific abnormal finding of lung field: Secondary | ICD-10-CM | POA: Diagnosis not present

## 2020-06-24 DIAGNOSIS — C7951 Secondary malignant neoplasm of bone: Secondary | ICD-10-CM

## 2020-06-24 DIAGNOSIS — C7952 Secondary malignant neoplasm of bone marrow: Secondary | ICD-10-CM

## 2020-06-24 DIAGNOSIS — F17210 Nicotine dependence, cigarettes, uncomplicated: Secondary | ICD-10-CM | POA: Diagnosis not present

## 2020-06-24 DIAGNOSIS — M898X8 Other specified disorders of bone, other site: Secondary | ICD-10-CM | POA: Diagnosis not present

## 2020-06-24 NOTE — Progress Notes (Signed)
The proposed treatment discussed in cancer conference 06/24/20 is for discussion purpose only and is not a binding recommendation.  The patient was not physically examined nor present for their treatment options.  Therefore, final treatment plans cannot be decided.   Dr. Lisbeth Renshaw updated on cancer conference.

## 2020-06-25 ENCOUNTER — Encounter (HOSPITAL_COMMUNITY): Payer: Self-pay

## 2020-06-25 NOTE — Progress Notes (Unsigned)
Todd Garner "Tim" Male, 64 y.o., 19-Jul-1956 MRN:  [id number] Phone:  [phone] Jerilynn Mages) PCP:  Tammi Sou, MD Coverage:  Sherre Poot [employer] Ppo Next Appt With Radiology ([facility]-CT 1) 06/27/2020 at 9:00 AM Message Received: Yesterday Message Details  Markus Daft, MD  [facility] Patient discussed at [facility] Thoracic conference.   [place] for CT guided core biopsy of pelvic / bone lesion. You should be getting a request soon.   Thanks,  Markus Daft

## 2020-06-27 DIAGNOSIS — C7951 Secondary malignant neoplasm of bone: Secondary | ICD-10-CM | POA: Insufficient documentation

## 2020-06-27 NOTE — Progress Notes (Signed)
Radiation Oncology         [phone] ________________________________  Name: Todd Garner MRN: [id number]  Date: 06/24/2020  DOB: 12-19-1955  CC:McGowen, Adrian Blackwater, MD  McGowen, Adrian Blackwater, MD     REFERRING PHYSICIAN: Anitra Lauth Adrian Blackwater, MD   DIAGNOSIS: The primary encounter diagnosis was Primary malignant neoplasm of left upper lobe of lung ([place]). Diagnoses of Secondary malignant neoplasm of bone and bone marrow ([place]) and Bone metastasis ([place]) were also pertinent to this visit.   HISTORY OF PRESENT ILLNESS::Todd Garner is a 64 y.o. male who is seen for an initial consultation visit regarding the patient's diagnosis of what appears to represent a stage IV lung cancer.  The patient has experienced some significant weight loss recently and was noted to have hepatomegaly on exam.  CT imaging demonstrated a large left hilar tumor in addition to associated mediastinal lymphadenopathy.  Multiple lesions were seen within the liver, apparent metastases, measuring up to 11 cm.  Bulky porta hepatis and retroperitoneal adenopathy was seen as well as bone metastases including the iliacs bilaterally.  Patient is having some significant left pelvic pain/left hip pain which appears to correspond to one dominant tumor within the left posterior pelvis.  The patient is also having significant difficulty with dysphagia.  I discussed with the patient and his wife that this may be related to some bulky mediastinal lymph nodes impinging on the esophagus.  A biopsy has not been performed yet.  The patient also is scheduled to see medical oncology next week.    PREVIOUS RADIATION THERAPY: No   PAST MEDICAL HISTORY:  has a past medical history of Allergic rhinitis, Anal condylomata, Anxiety, Cancer ([place]) (05/2020), GERD (gastroesophageal reflux disease), Gout, HTN (hypertension), Hyperlipidemia (2017), IFG (impaired fasting glucose) (2016), Palpable abdominal aorta (06/28/2011), and Tobacco dependence.     PAST  SURGICAL HISTORY: Past Surgical History:  Procedure Laterality Date  . anal wart excision    . ESOPHAGOGASTRODUODENOSCOPY  approx mid 1990s   pt reports normal.  . SKIN CANCER EXCISION  05/2011   [facility] from left side of face/neck     FAMILY HISTORY: family history includes Cancer (age of onset: 42) in his father; Hypertension in his father, mother, and paternal grandfather.   SOCIAL HISTORY:  reports that he has been smoking cigarettes. He has been smoking about 0.50 packs per day. He has never used smokeless tobacco. He reports current alcohol use. He reports that he does not use drugs.   ALLERGIES: Codeine   MEDICATIONS:  Current Outpatient Medications  Medication Sig Dispense Refill  . ALPRAZolam (XANAX) 0.5 MG tablet TAKE 1-2 TABLETS BY MOUTH TWICE A DAY AS NEEDED 360 tablet 1  . amLODipine-olmesartan (AZOR) 10-40 MG tablet TAKE 1 TABLET BY MOUTH EVERY DAY 90 tablet 0  . fluticasone (FLONASE) 50 MCG/ACT nasal spray 2 sprays in each nostril once daily for nasal allergy symptoms 48 g 3  . pantoprazole (PROTONIX) 40 MG tablet TAKE 1 TABLET BY MOUTH EVERY DAY 90 tablet 1  . baclofen (LIORESAL) 10 MG tablet Take 0.5-1 tablets (5-10 mg total) by mouth 3 (three) times daily as needed for muscle spasms. (Patient not taking: Reported on 06/08/2020) 30 each 3  . colchicine 0.6 MG tablet 2 tabs po at onset of gout flare, then 1 tab 2 hours later.  Then 1 tab po bid beginning the following day until no further pain (Patient not taking: Reported on 06/08/2020) 30 tablet 1  . oxyCODONE (OXY IR/ROXICODONE) 5  MG immediate release tablet 1-2 tabs po q6h prn pain (Patient not taking: Reported on 06/24/2020) 90 tablet 0  . predniSONE (DELTASONE) 10 MG tablet Take as directed for 12 days.  Daily dose 6,6,5,5,4,4,3,3,2,2,1,1. (Patient not taking: Reported on 06/08/2020) 42 tablet 0   No current facility-administered medications for this encounter.     REVIEW OF SYSTEMS:  A 15 point review of systems is  documented in the electronic medical record. This was obtained by the nursing staff. However, I reviewed this with the patient to discuss relevant findings and make appropriate changes.  Pertinent items are noted in HPI.    PHYSICAL EXAM:  weight is 136 lb 9.6 oz (62 kg).   ECOG = 2  0 - Asymptomatic (Fully active, able to carry on all predisease activities without restriction)  1 - Symptomatic but completely ambulatory (Restricted in physically strenuous activity but ambulatory and able to carry out work of a light or sedentary nature. For example, light housework, office work)  2 - Symptomatic, <50% in bed during the day (Ambulatory and capable of all self care but unable to carry out any work activities. Up and about more than 50% of waking hours)  3 - Symptomatic, >50% in bed, but not bedbound (Capable of only limited self-care, confined to bed or chair 50% or more of waking hours)  4 - Bedbound (Completely disabled. Cannot carry on any self-care. Totally confined to bed or chair)  5 - Death   Eustace Pen MM, Creech RH, Tormey DC, et al. [phone]). "Toxicity and response criteria of the [facility]". [place] Oncol. 5 (6): 649-55     LABORATORY DATA:  Lab Results  Component Value Date   WBC 8.3 06/08/2020   HGB 14.8 06/08/2020   HCT 44.5 06/08/2020   MCV 88.7 06/08/2020   PLT 399.0 06/08/2020   Lab Results  Component Value Date   NA 133 (L) 06/08/2020   K 5.2 No hemolysis seen (H) 06/08/2020   CL 94 (L) 06/08/2020   CO2 29 06/08/2020   Lab Results  Component Value Date   ALT 18 06/08/2020   AST 65 (H) 06/08/2020   ALKPHOS 330 (H) 06/08/2020   BILITOT 0.9 06/08/2020      RADIOGRAPHY: CT Chest W Contrast  Result Date: 06/17/2020 CLINICAL DATA:  Abnormal weight loss, smoker, hepatomegaly on exam. Palpable soft tissue mass and left lower back gluteal region. EXAM: CT CHEST, ABDOMEN, AND PELVIS WITH CONTRAST TECHNIQUE: Multidetector CT imaging of the  chest, abdomen and pelvis was performed following the standard protocol during bolus administration of intravenous contrast. CONTRAST:  138mL ISOVUE-300 IOPAMIDOL (ISOVUE-300) INJECTION 61% COMPARISON:  None. FINDINGS: CT CHEST FINDINGS Cardiovascular: Heart is normal size. Aorta is normal caliber. Coronary artery and aortic calcifications. No aneurysm. Mediastinum/Nodes: Large conglomerate mass in the left hilum and left upper lobe measuring 6.8 x 4.5 cm. AP window conglomerate adenopathy measures up to 2 cm in short axis diameter. Subcarinal adenopathy. No right hilar or axillary adenopathy. Lungs/Pleura: Centrilobular emphysema. Conglomerate central left upper lobe and left hilar nodal mass again noted as measured above. Remainder the lungs are clear. No effusions. Musculoskeletal: Chest wall soft tissues are unremarkable. CT ABDOMEN PELVIS FINDINGS Hepatobiliary: Large masses throughout the liver compatible with metastases. The largest measures 11 cm in the right hepatic lobe. Gallbladder unremarkable. Pancreas: No focal abnormality or ductal dilatation. Spleen: No focal abnormality.  Normal size. Adrenals/Urinary Tract: No adrenal abnormality. No focal renal abnormality. No stones or hydronephrosis.  Urinary bladder is unremarkable. Stomach/Bowel: Normal appendix. Stomach, large and small bowel grossly unremarkable. Vascular/Lymphatic: Diffuse aortic atherosclerosis. No aneurysm. Bulky retroperitoneal adenopathy. Left periaortic lymph node has a short axis diameter of 2.6 cm. Bulky porta hepatis adenopathy with short axis diameter 3.1 cm. Bilateral iliac nodal adenopathy, left greater than right with index left iliac lymph node having a short axis diameter of 2.4 cm. Reproductive: Mildly prominent prostate with central calcifications. Other: No free fluid or free air. Musculoskeletal: Large left gluteal mass measuring up to 8.2 cm. This likely arises from the left iliac bone with bone destruction and extension  into the gluteal soft tissues. This also extends across the left SI joint and involves the left sacrum. Large metastasis in the right iliac bone with soft tissue extension, measuring up to 4.3 cm. IMPRESSION: Large left hilar and central left upper lobe conglomerate mass most compatible with primary lung cancer. Associated mediastinal adenopathy. Numerous large metastases throughout the liver measuring up to 11 cm. Bulky porta hepatis and retroperitoneal/pelvic adenopathy. Large bone metastases in the iliac bones bilaterally. These are associated with bone destruction and extension into the adjacent soft tissues. Aortic atherosclerosis, coronary artery disease. Prostate enlargement with central calcifications. These results will be called to the ordering clinician or representative by the Radiologist Assistant, and communication documented in the PACS or [employer]. Electronically Signed   By: Rolm Baptise M.D.   On: 06/17/2020 15:29   CT Abdomen Pelvis W Contrast  Result Date: 06/17/2020 CLINICAL DATA:  Abnormal weight loss, smoker, hepatomegaly on exam. Palpable soft tissue mass and left lower back gluteal region. EXAM: CT CHEST, ABDOMEN, AND PELVIS WITH CONTRAST TECHNIQUE: Multidetector CT imaging of the chest, abdomen and pelvis was performed following the standard protocol during bolus administration of intravenous contrast. CONTRAST:  146mL ISOVUE-300 IOPAMIDOL (ISOVUE-300) INJECTION 61% COMPARISON:  None. FINDINGS: CT CHEST FINDINGS Cardiovascular: Heart is normal size. Aorta is normal caliber. Coronary artery and aortic calcifications. No aneurysm. Mediastinum/Nodes: Large conglomerate mass in the left hilum and left upper lobe measuring 6.8 x 4.5 cm. AP window conglomerate adenopathy measures up to 2 cm in short axis diameter. Subcarinal adenopathy. No right hilar or axillary adenopathy. Lungs/Pleura: Centrilobular emphysema. Conglomerate central left upper lobe and left hilar nodal mass again  noted as measured above. Remainder the lungs are clear. No effusions. Musculoskeletal: Chest wall soft tissues are unremarkable. CT ABDOMEN PELVIS FINDINGS Hepatobiliary: Large masses throughout the liver compatible with metastases. The largest measures 11 cm in the right hepatic lobe. Gallbladder unremarkable. Pancreas: No focal abnormality or ductal dilatation. Spleen: No focal abnormality.  Normal size. Adrenals/Urinary Tract: No adrenal abnormality. No focal renal abnormality. No stones or hydronephrosis. Urinary bladder is unremarkable. Stomach/Bowel: Normal appendix. Stomach, large and small bowel grossly unremarkable. Vascular/Lymphatic: Diffuse aortic atherosclerosis. No aneurysm. Bulky retroperitoneal adenopathy. Left periaortic lymph node has a short axis diameter of 2.6 cm. Bulky porta hepatis adenopathy with short axis diameter 3.1 cm. Bilateral iliac nodal adenopathy, left greater than right with index left iliac lymph node having a short axis diameter of 2.4 cm. Reproductive: Mildly prominent prostate with central calcifications. Other: No free fluid or free air. Musculoskeletal: Large left gluteal mass measuring up to 8.2 cm. This likely arises from the left iliac bone with bone destruction and extension into the gluteal soft tissues. This also extends across the left SI joint and involves the left sacrum. Large metastasis in the right iliac bone with soft tissue extension, measuring up to 4.3 cm. IMPRESSION: Large  left hilar and central left upper lobe conglomerate mass most compatible with primary lung cancer. Associated mediastinal adenopathy. Numerous large metastases throughout the liver measuring up to 11 cm. Bulky porta hepatis and retroperitoneal/pelvic adenopathy. Large bone metastases in the iliac bones bilaterally. These are associated with bone destruction and extension into the adjacent soft tissues. Aortic atherosclerosis, coronary artery disease. Prostate enlargement with central  calcifications. These results will be called to the ordering clinician or representative by the Radiologist Assistant, and communication documented in the PACS or [employer]. Electronically Signed   By: Rolm Baptise M.D.   On: 06/17/2020 15:29       IMPRESSION/ PLAN:  The patient has what appears to represent stage IV lung cancer.  I have placed an order for a biopsy, and his case has been discussed in thoracic conference with it being felt that a large posterior left pelvic tumor was the best candidate to obtain tissue.  This also is contributing to significant pain in this region.  I discussed potential palliative radiation treatment to this area.  It also appears that the patient would benefit from palliative thoracic radiation treatment as well, targeting both a left hilar tumor as well as some bulky mediastinal lymph nodes which may be contributing to some dysphagia.  I discussed all of this with the patient and his wife and all of their questions were answered.  They are eager to proceed with further evaluation as soon as possible.  I have asked for the biopsy to be completed as soon as possible.  The patient is going to focus on nutrition and hydration as well as possible.  I did discuss with the patient and his wife that presenting to the emergency room for possible admission may be necessary depending on how he is able to do with this.  Additionally, we discussed potentially proceeding with simulation in the near future as soon as we can obtain additional pathologic results.  Given current concerns for patient exposure during the COVID-19 pandemic, this encounter was conducted via telephone.  The patient has given verbal consent for this type of encounter. The time spent during this encounter today was 50 minutes including review of medical records, discussion with the patient and his family, and coordination of care. The attendants for this meeting included Dr. Lisbeth Renshaw, and the patient, his  wife, and a family friend named Bethena Roys.  During the encounter Dr. Lisbeth Renshaw was located at [facility] Radiation Oncology Department.  The patient  was located at home.        ________________________________   Jodelle Gross, MD, PhD   **Disclaimer: This note was dictated with voice recognition software. Similar sounding words can inadvertently be transcribed and this note may contain transcription errors which may not have been corrected upon publication of note.**

## 2020-06-27 NOTE — Progress Notes (Signed)
[place] [phone]   [phone]  CONSULT NOTE  REFERRING PHYSICIAN: Dr. Anitra Lauth MD  REASON FOR CONSULTATION:  Metastatic Carcinoma  HPI KEYSHAUN Garner is a 64 y.o. male with a past medical history significant for tobacco use, gout, GERD, hypertension, and allergic rhinitis is referred to clinic for evaluation of metastatic carcinoma.  The patient's evaluation started on 06/08/2020 after the patient was seen by his primary care provider for the chief complaint of several months of low back pain. The patient estimates his back pain started in April 2021. The patient had been seeing a chiropractor locally for his back pain.  The patient did not achieve significant relief undergoing chiropractic care and his chiropractor recommended that he be referred to a sports medicine physician to further evaluate him.  The patient was then seen by Dr. Owens Shark.  Dr. Owens Shark was concerned about a primary malignancy as the underlying etiology of his back pain due to the patient's associated undesired weight loss.  To the patient was evaluated by his PCP on 06/08/20.   He had tried to achieve pain relief with muscle relaxers, pain pills, prednisone, and SI joint injections without significant improvement in his symptoms.  His pain radiated down his left leg. On 06/08/20 was also noted on physical exam at that he had a soft tissue mass in the left lumbar region, hepatomegaly, and right supraclavicular lymphadenopathy which was concerning for malignancy.  The patient's PCP then obtained a CT scan of the chest, abdomen, and pelvis on 06/18/2020 which noted a large left hilar and central left upper lobe conglomerate mass consistent with primary lung cancer.  There is also associated mediastinal lymphadenopathy.  There was numerous liver metastases, the largest measuring up to 11 cm.  There was also associated bulky porta hepatis and retroperitoneal/pelvic adenopathy.  There is also a  large bone metastasis in the iliac bones bilaterally with bone destruction and extension into the adjacent soft tissue.   The patient was then referred to Dr. Lisbeth Renshaw in radiation oncology for consideration of palliative radiotherapy to the painful bone metastases.  The patient is scheduled to have a CT biopsy of the large soft tissue lesion tomorrow on 07/15/2020.  The patient was referred to the clinic today for further evaluation and recommendations for management of his current condition.  The patient is accompanied by his wife today.  The patient continues to endorse left lower back pain due to the soft tissue metastases.  He was recently given a prescription for oxycodone for the pain.  He states that this takes his pain down from a 7 to a 4.  He does not like taking the pain medication secondary to constipation. He had not been taking a stool softener. The patient also reports weight loss.  He states he normally weight 155 pounds; however, the patient weighs 125 pounds in the clinic today.  The patient states that he has an appetite but he feels "strangulated" when he eats. He gets choked up on solids and liquids. He also takes Protonix for reflux.  The patient denies any respiratory complaints.  He denies any chest pain, shortness of breath, cough, or hemoptysis.  He denies any nausea, vomiting, or diarrhea.  He had night sweats x1 approximately 1 month ago.  He states that he occasionally experiences a headache.  He is unable to localize his headache as it is not always in the same location.  He also was unable to estimate the duration of  the headache as that varies.  She also notes some visual changes were seen " streaks" in his visual field if he moves his head too quickly.  He denies any balance changes.   The patient's family history consists of a father who also had lung cancer.  The patient has a mother who has atrial fibrillation and dementia.  The patient has a brother and a half sister without  any known medical problems.  The patient works in Press photographer for [employer].  He is married and has 1 daughter.  He drinks approximately 1-3 beers per night.  The patient has been smoking since he was "a teen".  He smokes approximately 1 pack/day.   HPI  Past Medical History:  Diagnosis Date  . Allergic rhinitis   . Anal condylomata   . Anxiety   . Cancer ([place]) 05/2020   widespread (lung, liver, abd nodes, pelvis, extending into glut soft tissues-->no tissue dx as of 06/17/20-->referred to onc 06/17/20.  Presented as low back pain and abnormal weight loss.  Marland Kitchen GERD (gastroesophageal reflux disease)   . Gout   . HTN (hypertension)   . Hyperlipidemia 2017   Improved with TLC 11/2016  . IFG (impaired fasting glucose) 2016   A1c 5.2% 05/2015  . Palpable abdominal aorta 06/28/2011   Aortic u/s 07/03/11 showed NO AAA.  . Tobacco dependence    Trial of chantix 07/2017.  Ongoing, although much less, as of 07/2018.    Past Surgical History:  Procedure Laterality Date  . anal wart excision    . ESOPHAGOGASTRODUODENOSCOPY  approx mid 1990s   pt reports normal.  . SKIN CANCER EXCISION  05/2011   SSC from left side of face/neck    Family History  Problem Relation Age of Onset  . Hypertension Mother   . Cancer Father 76       lung cancer  . Hypertension Father   . Hypertension Paternal Grandfather     Social History Social History   Tobacco Use  . Smoking status: Current Every Day Smoker    Packs/day: 0.50    Types: Cigarettes  . Smokeless tobacco: Never Used  Substance Use Topics  . Alcohol use: Yes    Comment: beer 2-3 nights week  . Drug use: No    Allergies  Allergen Reactions  . Codeine     Current Outpatient Medications  Medication Sig Dispense Refill  . ALPRAZolam (XANAX) 0.5 MG tablet TAKE 1-2 TABLETS BY MOUTH TWICE A DAY AS NEEDED 360 tablet 1  . amLODipine-olmesartan (AZOR) 10-40 MG tablet TAKE 1 TABLET BY MOUTH EVERY DAY 90 tablet 0  . fluticasone (FLONASE) 50 MCG/ACT  nasal spray 2 sprays in each nostril once daily for nasal allergy symptoms 48 g 3  . oxyCODONE (OXY IR/ROXICODONE) 5 MG immediate release tablet 1-2 tabs po q6h prn pain 90 tablet 0  . pantoprazole (PROTONIX) 40 MG tablet TAKE 1 TABLET BY MOUTH EVERY DAY 90 tablet 1  . baclofen (LIORESAL) 10 MG tablet Take 0.5-1 tablets (5-10 mg total) by mouth 3 (three) times daily as needed for muscle spasms. (Patient not taking: Reported on 06/08/2020) 30 each 3  . colchicine 0.6 MG tablet 2 tabs po at onset of gout flare, then 1 tab 2 hours later.  Then 1 tab po bid beginning the following day until no further pain (Patient not taking: Reported on 06/08/2020) 30 tablet 1  . predniSONE (DELTASONE) 10 MG tablet Take as directed for 12 days.  Daily dose 6,6,5,5,4,4,3,3,2,2,1,1. (  Patient not taking: Reported on 06/08/2020) 42 tablet 0   No current facility-administered medications for this visit.   Facility-Administered Medications Ordered in Other Visits  Medication Dose Route Frequency Provider Last Rate Last Admin  . 0.9 %  sodium chloride infusion   Intravenous Continuous Leveta Wahab L, PA-C 20 mL/hr at 06/29/20 1219 New Bag at 06/29/20 1219    Review of Systems  REVIEW OF SYSTEMS:   Review of Systems  Constitutional: Positive for appetite change, weight loss, night sweats, and fatigue.  Negative for chills and fever. HENT: Positive for trouble swallowing. negative for mouth sores, nosebleeds, and sore throat.   Eyes: Positive for "streaks" in his vision if he moves his head too quickly.  Negative for eye problems and icterus.  Respiratory: Negative for cough, hemoptysis, shortness of breath and wheezing.   Cardiovascular: Negative for chest pain and leg swelling.  Gastrointestinal: Constipation secondary to pain medication use. Negative for abdominal pain,  diarrhea, nausea and vomiting.  Genitourinary: Positive for dark urine. Negative for bladder incontinence, difficulty urinating, dysuria,  frequency and hematuria.   Musculoskeletal: Positive for left lower back pain.  Negative for gait problem, neck pain and neck stiffness.  Skin: Negative for itching and rash.  Neurological: Negative for dizziness, extremity weakness, gait problem, headaches, light-headedness and seizures.  Hematological: Positive for right cervical and left supraclavicular lymphadenopathy.  Does not bruise/bleed easily.  Psychiatric/Behavioral: Negative for confusion, depression and sleep disturbance. The patient is not nervous/anxious.     PHYSICAL EXAMINATION:  Blood pressure (!) 94/57, pulse 96, temperature 97.9 F (36.6 C), temperature source Temporal, resp. rate 18, height 5\' 11"  (1.803 m), weight 125 lb (56.7 kg), SpO2 98 %.  ECOG PERFORMANCE STATUS: 2  Physical Exam  Constitutional: Oriented to person, place, and time and cachectic appearing male and in no distress.  HENT:  Head: Normocephalic and atraumatic.  Mouth/Throat: Oropharynx is clear and moist. No oropharyngeal exudate.  Eyes: Conjunctivae are normal. Right eye exhibits no discharge. Left eye exhibits no discharge. No scleral icterus.  Neck: Normal range of motion. Neck supple.  Cardiovascular: Normal rate, regular rhythm, normal heart sounds and intact distal pulses.   Pulmonary/Chest: Effort normal and breath sounds normal. No respiratory distress. No wheezes. No rales.  Abdominal: Soft. Bowel sounds are normal.  Hepatomegaly noted on exam. there is no tenderness.  Musculoskeletal: Soft tissue mass in the left upper gluteal section.  Normal range of motion. Exhibits no edema.  Lymphadenopathy:   Right cervical and left supraclavicular lymphadenopathy Neurological: Alert and oriented to person, place, and time.  Examined in the wheelchair.  Muscle wasting noted Skin: Skin is warm and dry. No rash noted. Not diaphoretic. No erythema. No pallor.  Psychiatric: Mood, memory and judgment normal.  Vitals reviewed.  LABORATORY DATA: Lab  Results  Component Value Date   WBC 9.3 06/29/2020   HGB 14.3 06/29/2020   HCT 41.5 06/29/2020   MCV 83.2 06/29/2020   PLT 359 06/29/2020      Chemistry      Component Value Date/Time   NA 130 (L) 06/29/2020 0939   K 5.3 (H) 06/29/2020 0939   CL 94 (L) 06/29/2020 0939   CO2 17 (L) 06/29/2020 0939   BUN 35 (H) 06/29/2020 0939   CREATININE 0.81 06/29/2020 0939   CREATININE 1.02 02/06/2019 1602      Component Value Date/Time   CALCIUM 12.1 (H) 06/29/2020 0939   ALKPHOS 459 (H) 06/29/2020 0939   AST 113 (H) 06/29/2020 7829  ALT 28 06/29/2020 0939   BILITOT 1.5 (H) 06/29/2020 0939       RADIOGRAPHIC STUDIES: CT Chest W Contrast  Result Date: 06/17/2020 CLINICAL DATA:  Abnormal weight loss, smoker, hepatomegaly on exam. Palpable soft tissue mass and left lower back gluteal region. EXAM: CT CHEST, ABDOMEN, AND PELVIS WITH CONTRAST TECHNIQUE: Multidetector CT imaging of the chest, abdomen and pelvis was performed following the standard protocol during bolus administration of intravenous contrast. CONTRAST:  128mL ISOVUE-300 IOPAMIDOL (ISOVUE-300) INJECTION 61% COMPARISON:  None. FINDINGS: CT CHEST FINDINGS Cardiovascular: Heart is normal size. Aorta is normal caliber. Coronary artery and aortic calcifications. No aneurysm. Mediastinum/Nodes: Large conglomerate mass in the left hilum and left upper lobe measuring 6.8 x 4.5 cm. AP window conglomerate adenopathy measures up to 2 cm in short axis diameter. Subcarinal adenopathy. No right hilar or axillary adenopathy. Lungs/Pleura: Centrilobular emphysema. Conglomerate central left upper lobe and left hilar nodal mass again noted as measured above. Remainder the lungs are clear. No effusions. Musculoskeletal: Chest wall soft tissues are unremarkable. CT ABDOMEN PELVIS FINDINGS Hepatobiliary: Large masses throughout the liver compatible with metastases. The largest measures 11 cm in the right hepatic lobe. Gallbladder unremarkable. Pancreas: No  focal abnormality or ductal dilatation. Spleen: No focal abnormality.  Normal size. Adrenals/Urinary Tract: No adrenal abnormality. No focal renal abnormality. No stones or hydronephrosis. Urinary bladder is unremarkable. Stomach/Bowel: Normal appendix. Stomach, large and small bowel grossly unremarkable. Vascular/Lymphatic: Diffuse aortic atherosclerosis. No aneurysm. Bulky retroperitoneal adenopathy. Left periaortic lymph node has a short axis diameter of 2.6 cm. Bulky porta hepatis adenopathy with short axis diameter 3.1 cm. Bilateral iliac nodal adenopathy, left greater than right with index left iliac lymph node having a short axis diameter of 2.4 cm. Reproductive: Mildly prominent prostate with central calcifications. Other: No free fluid or free air. Musculoskeletal: Large left gluteal mass measuring up to 8.2 cm. This likely arises from the left iliac bone with bone destruction and extension into the gluteal soft tissues. This also extends across the left SI joint and involves the left sacrum. Large metastasis in the right iliac bone with soft tissue extension, measuring up to 4.3 cm. IMPRESSION: Large left hilar and central left upper lobe conglomerate mass most compatible with primary lung cancer. Associated mediastinal adenopathy. Numerous large metastases throughout the liver measuring up to 11 cm. Bulky porta hepatis and retroperitoneal/pelvic adenopathy. Large bone metastases in the iliac bones bilaterally. These are associated with bone destruction and extension into the adjacent soft tissues. Aortic atherosclerosis, coronary artery disease. Prostate enlargement with central calcifications. These results will be called to the ordering clinician or representative by the Radiologist Assistant, and communication documented in the PACS or [employer]. Electronically Signed   By: Rolm Baptise M.D.   On: 06/17/2020 15:29   CT Abdomen Pelvis W Contrast  Result Date: 06/17/2020 CLINICAL DATA:   Abnormal weight loss, smoker, hepatomegaly on exam. Palpable soft tissue mass and left lower back gluteal region. EXAM: CT CHEST, ABDOMEN, AND PELVIS WITH CONTRAST TECHNIQUE: Multidetector CT imaging of the chest, abdomen and pelvis was performed following the standard protocol during bolus administration of intravenous contrast. CONTRAST:  159mL ISOVUE-300 IOPAMIDOL (ISOVUE-300) INJECTION 61% COMPARISON:  None. FINDINGS: CT CHEST FINDINGS Cardiovascular: Heart is normal size. Aorta is normal caliber. Coronary artery and aortic calcifications. No aneurysm. Mediastinum/Nodes: Large conglomerate mass in the left hilum and left upper lobe measuring 6.8 x 4.5 cm. AP window conglomerate adenopathy measures up to 2 cm in short axis diameter. Subcarinal adenopathy.  No right hilar or axillary adenopathy. Lungs/Pleura: Centrilobular emphysema. Conglomerate central left upper lobe and left hilar nodal mass again noted as measured above. Remainder the lungs are clear. No effusions. Musculoskeletal: Chest wall soft tissues are unremarkable. CT ABDOMEN PELVIS FINDINGS Hepatobiliary: Large masses throughout the liver compatible with metastases. The largest measures 11 cm in the right hepatic lobe. Gallbladder unremarkable. Pancreas: No focal abnormality or ductal dilatation. Spleen: No focal abnormality.  Normal size. Adrenals/Urinary Tract: No adrenal abnormality. No focal renal abnormality. No stones or hydronephrosis. Urinary bladder is unremarkable. Stomach/Bowel: Normal appendix. Stomach, large and small bowel grossly unremarkable. Vascular/Lymphatic: Diffuse aortic atherosclerosis. No aneurysm. Bulky retroperitoneal adenopathy. Left periaortic lymph node has a short axis diameter of 2.6 cm. Bulky porta hepatis adenopathy with short axis diameter 3.1 cm. Bilateral iliac nodal adenopathy, left greater than right with index left iliac lymph node having a short axis diameter of 2.4 cm. Reproductive: Mildly prominent prostate  with central calcifications. Other: No free fluid or free air. Musculoskeletal: Large left gluteal mass measuring up to 8.2 cm. This likely arises from the left iliac bone with bone destruction and extension into the gluteal soft tissues. This also extends across the left SI joint and involves the left sacrum. Large metastasis in the right iliac bone with soft tissue extension, measuring up to 4.3 cm. IMPRESSION: Large left hilar and central left upper lobe conglomerate mass most compatible with primary lung cancer. Associated mediastinal adenopathy. Numerous large metastases throughout the liver measuring up to 11 cm. Bulky porta hepatis and retroperitoneal/pelvic adenopathy. Large bone metastases in the iliac bones bilaterally. These are associated with bone destruction and extension into the adjacent soft tissues. Aortic atherosclerosis, coronary artery disease. Prostate enlargement with central calcifications. These results will be called to the ordering clinician or representative by the Radiologist Assistant, and communication documented in the PACS or [employer]. Electronically Signed   By: Rolm Baptise M.D.   On: 06/17/2020 15:29    ASSESSMENT: This is a very pleasant 65 year old male referred to the clinic for stage IV metastatic carcinoma, likely lung primary.  The patient presented with a large left hilar/central mass, numerous/large liver metastases, lymphadenopathy in the mediastinal, right supraclavicular, bulky porta hepatis, retroperitoneal, and pelvis.  The patient also has large bone metastases in the iliac bones bilaterally with associated bone destruction and extension into the adjacent soft tissues.  He was diagnosed in July 2021.  Final pathology has not been obtained.   PLAN: The patient was seen with Dr. Julien Nordmann today.  Dr. Julien Nordmann had a lengthy discussion with the patient and his wife about his current condition and recommendations for further work-up.  Treatment will be  determined based on the final pathology.  The patient scheduled for a CT guided biopsy of the large soft tissue lesions tomorrow.  The patient was strongly encouraged to keep his appointment as planned.  The patient will need a brain MRI as well as a PET scan to complete the staging work-up. I will place the orders today.  The patient's lab work today demonstrates hypercalcemia.  We will arrange for the patient to receive 1 L of fluid as well as Zometa in the clinic today.  We will see the patient back in 10-14 days for a more detailed discussion about his current condition and recommended treatment options based on the final pathology as well as the imaging studies.  In the meantime, the patient will continue with radiation oncology for palliative radiotherapy to the painful osseous metastases.  The patient has a phone visit with nutrition next week regarding his weight loss. I have reached out to them to ask that this visit be changed to a phone visit per the patient/wife request. For the dysphagia, the patient had evidence of thrush on exam. Will send diflucan to his pharmacy. Left instructions for patient to hold his colchicine while taking diflucan due to drug to drug interaction.   The patient voices understanding of current disease status and treatment options and is in agreement with the current care plan.  All questions were answered. The patient knows to call the clinic with any problems, questions or concerns. We can certainly see the patient much sooner if necessary.  Thank you so much for allowing me to participate in the care of Todd Garner. I will continue to follow up the patient with you and assist in his care.  The total time spent in the appointment was 60 minutes  Disclaimer: This note was dictated with voice recognition software. Similar sounding words can inadvertently be transcribed and may not be corrected upon review.   Jonnette Nuon L Eliyanna Ault June 29, 2020,  1:17 PM  ADDENDUM: Hematology/Oncology Attending: I had a face-to-face encounter with the patient today.  I recommended his care plan.  This is a very pleasant 64 years old white male  has been complaining of low back pain for several months.  He was seen by his primary care physician as well as chiropractor and undergoing physical therapy with no improvement in his condition.  He was referred back to his primary care physician and CT scan of the chest, abdomen pelvis were performed on June 17, 2020 and it showed large conglomerate mass in the left hilum and left upper lobe measuring 6.8 x 4.5 cm.  There was AP window conglomerate adenopathy measuring up to 2.0 cm in short axis.  In addition to subcarinal lymphadenopathy.  The scan also showed numerous large metastasis throughout the liver measuring up to 11.0 cm with bulky porta hepatis and retroperitoneal/pelvic adenopathy.  There was also a large bone metastasis in the iliac bone bilaterally associated with bone destruction and extension into the adjacent soft tissue. The patient was referred to me today for evaluation and recommendation regarding treatment of his condition. He had a virtual phone visit with Dr. Lisbeth Renshaw for consideration of palliative radiotherapy to the painful metastatic bone lesion.  Dr. Lisbeth Renshaw ordered a CT-guided core biopsy of the left gluteal mass and expected to be done tomorrow. When seen today the patient continues to have pain in the bilateral gluteal areas. I had a lengthy discussion with the patient and his wife today about his current disease stage, and treatment options. Definitely will need a tissue biopsy for confirmation of the pathology before making recommendation regarding the systemic treatment options. I recommended for the patient to have a PET scan as well as MRI of the brain to complete the staging work-up. He will also continue with the palliative radiotherapy under the care of Dr. Lisbeth Renshaw as planned. We will see  the patient back for follow-up visit in around 2 weeks or less for more detailed discussion of his treatment options based on the final pathology and staging work-up. The patient and his wife agreed to the current plan. He was advised to call immediately if he has any concerning symptoms in the interval.  Disclaimer: This note was dictated with voice recognition software. Similar sounding words can inadvertently be transcribed and may be missed upon review. Fanny Bien  Julien Nordmann, MD 06/29/20

## 2020-06-28 ENCOUNTER — Telehealth: Payer: Self-pay | Admitting: Radiation Oncology

## 2020-06-28 ENCOUNTER — Telehealth: Payer: Self-pay | Admitting: *Deleted

## 2020-06-28 DIAGNOSIS — C7951 Secondary malignant neoplasm of bone: Secondary | ICD-10-CM

## 2020-06-28 NOTE — Telephone Encounter (Signed)
I received a message from patients wife and I called her back.  She spoke about her husband and her concerns.  She is concerned about his nutrition and future plan.  I updated her on next steps, appts, and referral.  She was thankful for the update.

## 2020-06-28 NOTE — Telephone Encounter (Signed)
Scheduled appt per 7/12 sch  msg - unable to reach pt- left message with appt date and time

## 2020-06-28 NOTE — Telephone Encounter (Signed)
I called and spoke with the patient and his wife to let them know I was checking on his status, he will be here tomorrow for an appointment with Dr. Julien Nordmann, and we will plan to simulate tomorrow to try and make the most of his time while he is here in the cancer center.  He is aware that we would not be able to start until next week so that Dr. Lisbeth Renshaw can plan his treatment and for [place] to confirm his suspected diagnosis of malignancy.

## 2020-06-29 ENCOUNTER — Inpatient Hospital Stay: Payer: BC Managed Care – PPO

## 2020-06-29 ENCOUNTER — Other Ambulatory Visit: Payer: Self-pay

## 2020-06-29 ENCOUNTER — Other Ambulatory Visit: Payer: Self-pay | Admitting: Physician Assistant

## 2020-06-29 ENCOUNTER — Ambulatory Visit
Admission: RE | Admit: 2020-06-29 | Discharge: 2020-06-29 | Disposition: A | Discharge status: Home / Self Care | Payer: BC Managed Care – PPO | Source: Ambulatory Visit | Attending: Radiation Oncology | Admitting: Radiation Oncology

## 2020-06-29 ENCOUNTER — Encounter: Payer: Self-pay | Admitting: Physician Assistant

## 2020-06-29 ENCOUNTER — Other Ambulatory Visit: Payer: Self-pay | Admitting: Radiology

## 2020-06-29 ENCOUNTER — Other Ambulatory Visit: Payer: Self-pay | Admitting: Family Medicine

## 2020-06-29 ENCOUNTER — Inpatient Hospital Stay: Payer: BC Managed Care – PPO | Attending: Physician Assistant | Admitting: Physician Assistant

## 2020-06-29 VITALS — BP 94/57 | HR 96 | Temp 97.9°F | Resp 18 | Ht 71.0 in | Wt 125.0 lb

## 2020-06-29 DIAGNOSIS — W19XXXA Unspecified fall, initial encounter: Secondary | ICD-10-CM | POA: Diagnosis present

## 2020-06-29 DIAGNOSIS — Z66 Do not resuscitate: Secondary | ICD-10-CM | POA: Diagnosis not present

## 2020-06-29 DIAGNOSIS — C3490 Malignant neoplasm of unspecified part of unspecified bronchus or lung: Secondary | ICD-10-CM | POA: Diagnosis not present

## 2020-06-29 DIAGNOSIS — N400 Enlarged prostate without lower urinary tract symptoms: Secondary | ICD-10-CM

## 2020-06-29 DIAGNOSIS — R918 Other nonspecific abnormal finding of lung field: Secondary | ICD-10-CM

## 2020-06-29 DIAGNOSIS — C801 Malignant (primary) neoplasm, unspecified: Secondary | ICD-10-CM

## 2020-06-29 DIAGNOSIS — F17210 Nicotine dependence, cigarettes, uncomplicated: Secondary | ICD-10-CM | POA: Diagnosis not present

## 2020-06-29 DIAGNOSIS — I959 Hypotension, unspecified: Secondary | ICD-10-CM | POA: Insufficient documentation

## 2020-06-29 DIAGNOSIS — Z681 Body mass index (BMI) 19 or less, adult: Secondary | ICD-10-CM | POA: Diagnosis not present

## 2020-06-29 DIAGNOSIS — A419 Sepsis, unspecified organism: Secondary | ICD-10-CM | POA: Diagnosis not present

## 2020-06-29 DIAGNOSIS — R1310 Dysphagia, unspecified: Secondary | ICD-10-CM | POA: Diagnosis present

## 2020-06-29 DIAGNOSIS — Z801 Family history of malignant neoplasm of trachea, bronchus and lung: Secondary | ICD-10-CM

## 2020-06-29 DIAGNOSIS — I700 Atherosclerosis of aorta: Secondary | ICD-10-CM | POA: Diagnosis present

## 2020-06-29 DIAGNOSIS — I2510 Atherosclerotic heart disease of native coronary artery without angina pectoris: Secondary | ICD-10-CM

## 2020-06-29 DIAGNOSIS — M7989 Other specified soft tissue disorders: Secondary | ICD-10-CM | POA: Diagnosis not present

## 2020-06-29 DIAGNOSIS — C787 Secondary malignant neoplasm of liver and intrahepatic bile duct: Secondary | ICD-10-CM | POA: Diagnosis not present

## 2020-06-29 DIAGNOSIS — Z20822 Contact with and (suspected) exposure to covid-19: Secondary | ICD-10-CM | POA: Diagnosis not present

## 2020-06-29 DIAGNOSIS — Z8249 Family history of ischemic heart disease and other diseases of the circulatory system: Secondary | ICD-10-CM

## 2020-06-29 DIAGNOSIS — C7A1 Malignant poorly differentiated neuroendocrine tumors: Secondary | ICD-10-CM | POA: Diagnosis not present

## 2020-06-29 DIAGNOSIS — E860 Dehydration: Secondary | ICD-10-CM | POA: Diagnosis not present

## 2020-06-29 DIAGNOSIS — Z7952 Long term (current) use of systemic steroids: Secondary | ICD-10-CM

## 2020-06-29 DIAGNOSIS — J189 Pneumonia, unspecified organism: Secondary | ICD-10-CM | POA: Diagnosis not present

## 2020-06-29 DIAGNOSIS — C3402 Malignant neoplasm of left main bronchus: Secondary | ICD-10-CM | POA: Diagnosis not present

## 2020-06-29 DIAGNOSIS — E162 Hypoglycemia, unspecified: Secondary | ICD-10-CM | POA: Diagnosis present

## 2020-06-29 DIAGNOSIS — Z515 Encounter for palliative care: Secondary | ICD-10-CM | POA: Diagnosis not present

## 2020-06-29 DIAGNOSIS — Z85828 Personal history of other malignant neoplasm of skin: Secondary | ICD-10-CM

## 2020-06-29 DIAGNOSIS — C7931 Secondary malignant neoplasm of brain: Secondary | ICD-10-CM | POA: Diagnosis not present

## 2020-06-29 DIAGNOSIS — R0602 Shortness of breath: Secondary | ICD-10-CM | POA: Diagnosis not present

## 2020-06-29 DIAGNOSIS — E43 Unspecified severe protein-calorie malnutrition: Secondary | ICD-10-CM | POA: Diagnosis not present

## 2020-06-29 DIAGNOSIS — R64 Cachexia: Secondary | ICD-10-CM | POA: Diagnosis not present

## 2020-06-29 DIAGNOSIS — J690 Pneumonitis due to inhalation of food and vomit: Secondary | ICD-10-CM | POA: Diagnosis not present

## 2020-06-29 DIAGNOSIS — J90 Pleural effusion, not elsewhere classified: Secondary | ICD-10-CM | POA: Diagnosis not present

## 2020-06-29 DIAGNOSIS — B370 Candidal stomatitis: Secondary | ICD-10-CM | POA: Diagnosis not present

## 2020-06-29 DIAGNOSIS — J9601 Acute respiratory failure with hypoxia: Secondary | ICD-10-CM | POA: Diagnosis not present

## 2020-06-29 DIAGNOSIS — M9985 Other biomechanical lesions of pelvic region: Secondary | ICD-10-CM | POA: Diagnosis not present

## 2020-06-29 DIAGNOSIS — I10 Essential (primary) hypertension: Secondary | ICD-10-CM

## 2020-06-29 DIAGNOSIS — E875 Hyperkalemia: Secondary | ICD-10-CM | POA: Diagnosis not present

## 2020-06-29 DIAGNOSIS — C7951 Secondary malignant neoplasm of bone: Secondary | ICD-10-CM | POA: Diagnosis not present

## 2020-06-29 DIAGNOSIS — F419 Anxiety disorder, unspecified: Secondary | ICD-10-CM

## 2020-06-29 DIAGNOSIS — C799 Secondary malignant neoplasm of unspecified site: Secondary | ICD-10-CM | POA: Insufficient documentation

## 2020-06-29 DIAGNOSIS — Z79899 Other long term (current) drug therapy: Secondary | ICD-10-CM

## 2020-06-29 DIAGNOSIS — E785 Hyperlipidemia, unspecified: Secondary | ICD-10-CM

## 2020-06-29 DIAGNOSIS — R05 Cough: Secondary | ICD-10-CM | POA: Diagnosis not present

## 2020-06-29 DIAGNOSIS — R4182 Altered mental status, unspecified: Secondary | ICD-10-CM | POA: Diagnosis not present

## 2020-06-29 DIAGNOSIS — C3412 Malignant neoplasm of upper lobe, left bronchus or lung: Secondary | ICD-10-CM | POA: Diagnosis not present

## 2020-06-29 LAB — CBC WITH DIFFERENTIAL (CANCER CENTER ONLY)
Abs Immature Granulocytes: 0.07 10*3/uL (ref 0.00–0.07)
Basophils Absolute: 0 10*3/uL (ref 0.0–0.1)
Basophils Relative: 0 %
Eosinophils Absolute: 0 10*3/uL (ref 0.0–0.5)
Eosinophils Relative: 0 %
HCT: 41.5 % (ref 39.0–52.0)
Hemoglobin: 14.3 g/dL (ref 13.0–17.0)
Immature Granulocytes: 1 %
Lymphocytes Relative: 5 %
Lymphs Abs: 0.5 10*3/uL — ABNORMAL LOW (ref 0.7–4.0)
MCH: 28.7 pg (ref 26.0–34.0)
MCHC: 34.5 g/dL (ref 30.0–36.0)
MCV: 83.2 fL (ref 80.0–100.0)
Monocytes Absolute: 1 10*3/uL (ref 0.1–1.0)
Monocytes Relative: 10 %
Neutro Abs: 7.8 10*3/uL — ABNORMAL HIGH (ref 1.7–7.7)
Neutrophils Relative %: 84 %
Platelet Count: 359 10*3/uL (ref 150–400)
RBC: 4.99 MIL/uL (ref 4.22–5.81)
RDW: 13.6 % (ref 11.5–15.5)
WBC Count: 9.3 10*3/uL (ref 4.0–10.5)
nRBC: 0 % (ref 0.0–0.2)

## 2020-06-29 LAB — CMP (CANCER CENTER ONLY)
ALT: 28 U/L (ref 0–44)
AST: 113 U/L — ABNORMAL HIGH (ref 15–41)
Albumin: 2.7 g/dL — ABNORMAL LOW (ref 3.5–5.0)
Alkaline Phosphatase: 459 U/L — ABNORMAL HIGH (ref 38–126)
Anion gap: 19 — ABNORMAL HIGH (ref 5–15)
BUN: 35 mg/dL — ABNORMAL HIGH (ref 8–23)
CO2: 17 mmol/L — ABNORMAL LOW (ref 22–32)
Calcium: 12.1 mg/dL — ABNORMAL HIGH (ref 8.9–10.3)
Chloride: 94 mmol/L — ABNORMAL LOW (ref 98–111)
Creatinine: 0.81 mg/dL (ref 0.61–1.24)
GFR, Est AFR Am: >60 mL/min
GFR, Estimated: >60 mL/min
Glucose, Bld: 73 mg/dL (ref 70–99)
Potassium: 5.3 mmol/L — ABNORMAL HIGH (ref 3.5–5.1)
Sodium: 130 mmol/L — ABNORMAL LOW (ref 135–145)
Total Bilirubin: 1.5 mg/dL — ABNORMAL HIGH (ref 0.3–1.2)
Total Protein: 6.5 g/dL (ref 6.5–8.1)

## 2020-06-29 MED ORDER — SODIUM CHLORIDE 0.9 % IV SOLN
INTRAVENOUS | Status: DC
  Filled 2020-06-29: qty 250

## 2020-06-29 MED ORDER — SODIUM CHLORIDE 0.9 % IV SOLN
Freq: Once | INTRAVENOUS | Status: AC
  Filled 2020-06-29: qty 250

## 2020-06-29 MED ORDER — ZOLEDRONIC ACID 4 MG/100ML IV SOLN
INTRAVENOUS | Status: AC
  Filled 2020-06-29: qty 100

## 2020-06-29 MED ORDER — FLUCONAZOLE 100 MG PO TABS: 100.0000 mg | ORAL_TABLET | Freq: Every day | ORAL | 0 refills | Status: AC

## 2020-06-29 MED ORDER — ZOLEDRONIC ACID 4 MG/100ML IV SOLN
4.0000 mg | Freq: Once | INTRAVENOUS | Status: AC
  Administered 2020-06-29: 4 mg via INTRAVENOUS

## 2020-06-29 NOTE — Patient Instructions (Signed)
-  It was nice meeting both of you today.  We discussed a lot of important information.  Here is a summary of the main points moving forward. -Please keep your appointment tomorrow for the CT-guided biopsy.  This will help [place] determine the name of the tumor.  This information is very important for [place] because it helps [place] determine what the best treatment options are for you. -Continue following with Dr. Lisbeth Renshaw and radiation oncology.  He will help plan your radiation treatments which will help with the pain which is caused by the tumor which is eating into the bones and causing you significant low back pain. -I have placed an order for an MRI of the brain as well as a PET scan which helps [place] make sure we are getting a complete picture of the areas involved.  You should hear from the radiology department in the next few days to schedule your scan.  If you do not hear from them, please give them a call.  Their number is [phone].  If you ever need to call them, all you need to do is tell them your name and her date of birth and that you are trying to schedule your scans.  With that information they should be able to open your chart and you should know exactly what scan you are trying to schedule. -I have placed a referral to the dietitian for the weight loss.  -Some of your electrolytes are a little bit off today because calcium is moving from your bones into your bloodstream which is causing it to be high on your labs.  We will give you a medicine to lower your calcium as well as fluids while you are in the clinic today.

## 2020-06-29 NOTE — Patient Instructions (Signed)
Zoledronic Acid injection (Hypercalcemia, Oncology) What is this medicine? ZOLEDRONIC ACID (ZOE le dron ik AS id) lowers the amount of calcium loss from bone. It is used to treat too much calcium in your blood from cancer. It is also used to prevent complications of cancer that has spread to the bone. This medicine may be used for other purposes; ask your health care provider or pharmacist if you have questions. COMMON BRAND NAME(S): Zometa What should I tell my health care provider before I take this medicine? They need to know if you have any of these conditions:  aspirin-sensitive asthma  cancer, especially if you are receiving medicines used to treat cancer  dental disease or wear dentures  infection  kidney disease  receiving corticosteroids like dexamethasone or prednisone  an unusual or allergic reaction to zoledronic acid, other medicines, foods, dyes, or preservatives  pregnant or trying to get pregnant  breast-feeding How should I use this medicine? This medicine is for infusion into a vein. It is given by a health care professional in a hospital or clinic setting. Talk to your pediatrician regarding the use of this medicine in children. Special care may be needed. Overdosage: If you think you have taken too much of this medicine contact a poison control center or emergency room at once. NOTE: This medicine is only for you. Do not share this medicine with others. What if I miss a dose? It is important not to miss your dose. Call your doctor or health care professional if you are unable to keep an appointment. What may interact with this medicine?  certain antibiotics given by injection  NSAIDs, medicines for pain and inflammation, like ibuprofen or naproxen  some diuretics like bumetanide, furosemide  teriparatide  thalidomide This list may not describe all possible interactions. Give your health care provider a list of all the medicines, herbs, non-prescription  drugs, or dietary supplements you use. Also tell them if you smoke, drink alcohol, or use illegal drugs. Some items may interact with your medicine. What should I watch for while using this medicine? Visit your doctor or health care professional for regular checkups. It may be some time before you see the benefit from this medicine. Do not stop taking your medicine unless your doctor tells you to. Your doctor may order blood tests or other tests to see how you are doing. Women should inform their doctor if they wish to become pregnant or think they might be pregnant. There is a potential for serious side effects to an unborn child. Talk to your health care professional or pharmacist for more information. You should make sure that you get enough calcium and vitamin D while you are taking this medicine. Discuss the foods you eat and the vitamins you take with your health care professional. Some people who take this medicine have severe bone, joint, and/or muscle pain. This medicine may also increase your risk for jaw problems or a broken thigh bone. Tell your doctor right away if you have severe pain in your jaw, bones, joints, or muscles. Tell your doctor if you have any pain that does not go away or that gets worse. Tell your dentist and dental surgeon that you are taking this medicine. You should not have major dental surgery while on this medicine. See your dentist to have a dental exam and fix any dental problems before starting this medicine. Take good care of your teeth while on this medicine. Make sure you see your dentist for regular follow-up  appointments. What side effects may I notice from receiving this medicine? Side effects that you should report to your doctor or health care professional as soon as possible:  allergic reactions like skin rash, itching or hives, swelling of the face, lips, or tongue  anxiety, confusion, or depression  breathing problems  changes in vision  eye  pain  feeling faint or lightheaded, falls  jaw pain, especially after dental work  mouth sores  muscle cramps, stiffness, or weakness  redness, blistering, peeling or loosening of the skin, including inside the mouth  trouble passing urine or change in the amount of urine Side effects that usually do not require medical attention (report to your doctor or health care professional if they continue or are bothersome):  bone, joint, or muscle pain  constipation  diarrhea  fever  hair loss  irritation at site where injected  loss of appetite  nausea, vomiting  stomach upset  trouble sleeping  trouble swallowing  weak or tired This list may not describe all possible side effects. Call your doctor for medical advice about side effects. You may report side effects to FDA at 1-800-FDA-1088. Where should I keep my medicine? This drug is given in a hospital or clinic and will not be stored at home. NOTE: This sheet is a summary. It may not cover all possible information. If you have questions about this medicine, talk to your doctor, pharmacist, or health care provider.  2020 Elsevier/Gold Standard (2014-05-02 14:19:39)    Dehydration, Adult Dehydration is a condition in which there is not enough water or other fluids in the body. This happens when a person loses more fluids than he or she takes in. Important organs, such as the kidneys, brain, and heart, cannot function without a proper amount of fluids. Any loss of fluids from the body can lead to dehydration. Dehydration can be mild, moderate, or severe. It should be treated right away to prevent it from becoming severe. What are the causes? Dehydration may be caused by:  Conditions that cause loss of water or other fluids, such as diarrhea, vomiting, or sweating or urinating a lot.  Not drinking enough fluids, especially when you are ill or doing activities that require a lot of energy.  Other illnesses and  conditions, such as fever or infection.  Certain medicines, such as medicines that remove excess fluid from the body (diuretics).  Lack of safe drinking water.  Not being able to get enough water and food. What increases the risk? The following factors may make you more likely to develop this condition:  Having a long-term (chronic) illness that has not been treated properly, such as diabetes, heart disease, or kidney disease.  Being 39 years of age or older.  Having a disability.  Living in a place that is high in altitude, where thinner, drier air causes more fluid loss.  Doing exercises that put stress on your body for a long time (endurance sports). What are the signs or symptoms? Symptoms of dehydration depend on how severe it is. Mild or moderate dehydration  Thirst.  Dry lips or dry mouth.  Dizziness or light-headedness, especially when standing up from a seated position.  Muscle cramps.  Dark urine. Urine may be the color of tea.  Less urine or tears produced than usual.  Headache. Severe dehydration  Changes in skin. Your skin may be cold and clammy, blotchy, or pale. Your skin also may not return to normal after being lightly pinched and released.  Little or no tears, urine, or sweat.  Changes in vital signs, such as rapid breathing and low blood pressure. Your pulse may be weak or may be faster than 100 beats a minute when you are sitting still.  Other changes, such as: ? Feeling very thirsty. ? Sunken eyes. ? Cold hands and feet. ? Confusion. ? Being very tired (lethargic) or having trouble waking from sleep. ? Short-term weight loss. ? Loss of consciousness. How is this diagnosed? This condition is diagnosed based on your symptoms and a physical exam. You may have blood and urine tests to help confirm the diagnosis. How is this treated? Treatment for this condition depends on how severe it is. Treatment should be started right away. Do not wait  until dehydration becomes severe. Severe dehydration is an emergency and needs to be treated in a hospital.  Mild or moderate dehydration can be treated at home. You may be asked to: ? Drink more fluids. ? Drink an oral rehydration solution (ORS). This drink helps restore proper amounts of fluids and salts and minerals in the blood (electrolytes).  Severe dehydration can be treated: ? With IV fluids. ? By correcting abnormal levels of electrolytes. This is often done by giving electrolytes through a tube that is passed through your nose and into your stomach (nasogastric tube, or NG tube). ? By treating the underlying cause of dehydration. Follow these instructions at home: Oral rehydration solution If told by your health care provider, drink an ORS:  Make an ORS by following instructions on the package.  Start by drinking small amounts, about  cup (120 mL) every 5-10 minutes.  Slowly increase how much you drink until you have taken the amount recommended by your health care provider. Eating and drinking         Drink enough clear fluid to keep your urine pale yellow. If you were told to drink an ORS, finish the ORS first and then start slowly drinking other clear fluids. Drink fluids such as: ? Water. Do not drink only water. Doing that can lead to hyponatremia, which is having too little salt (sodium) in the body. ? Water from ice chips you suck on. ? Fruit juice that you have added water to (diluted fruit juice). ? Low-calorie sports drinks.  Eat foods that contain a healthy balance of electrolytes, such as bananas, oranges, potatoes, tomatoes, and spinach.  Do not drink alcohol.  Avoid the following: ? Drinks that contain a lot of sugar. These include high-calorie sports drinks, fruit juice that is not diluted, and soda. ? Caffeine. ? Foods that are greasy or contain a lot of fat or sugar. General instructions  Take over-the-counter and prescription medicines only as  told by your health care provider.  Do not take sodium tablets. Doing that can lead to having too much sodium in the body (hypernatremia).  Return to your normal activities as told by your health care provider. Ask your health care provider what activities are safe for you.  Keep all follow-up visits as told by your health care provider. This is important. Contact a health care provider if:  You have muscle cramps, pain, or discomfort, such as: ? Pain in your abdomen and the pain gets worse or stays in one area (localizes). ? Stiff neck.  You have a rash.  You are more irritable than usual.  You are sleepier or have a harder time waking than usual.  You feel weak or dizzy.  You feel very thirsty. Get  help right away if you have:  Any symptoms of severe dehydration.  Symptoms of vomiting, such as: ? You cannot eat or drink without vomiting. ? Vomiting gets worse or does not go away. ? Vomit includes blood or green matter (bile).  Symptoms that get worse with treatment.  A fever.  A severe headache.  Problems with urination or bowel movements, such as: ? Diarrhea that gets worse or does not go away. ? Blood in your stool (feces). This may cause stool to look black and tarry. ? Not urinating, or urinating only a small amount of very dark urine, within 6-8 hours.  Trouble breathing. These symptoms may represent a serious problem that is an emergency. Do not wait to see if the symptoms will go away. Get medical help right away. Call your local emergency services (911 in the [place] Do not drive yourself to the hospital. Summary  Dehydration is a condition in which there is not enough water or other fluids in the body. This happens when a person loses more fluids than he or she takes in.  Treatment for this condition depends on how severe it is. Treatment should be started right away. Do not wait until dehydration becomes severe.  Drink enough clear fluid to keep your urine  pale yellow. If you were told to drink an oral rehydration solution (ORS), finish the ORS first and then start slowly drinking other clear fluids.  Take over-the-counter and prescription medicines only as told by your health care provider.  Get help right away if you have any symptoms of severe dehydration. This information is not intended to replace advice given to you by your health care provider. Make sure you discuss any questions you have with your health care provider. Document Revised: 07/17/2019 Document Reviewed: 07/17/2019 Elsevier Patient Education  [place].

## 2020-06-30 ENCOUNTER — Inpatient Hospital Stay (HOSPITAL_COMMUNITY)
Admission: EM | Admit: 2020-06-30 | Discharge: 2020-07-18 | DRG: 853 | Disposition: E | Payer: BC Managed Care – PPO | Attending: Internal Medicine | Admitting: Internal Medicine

## 2020-06-30 ENCOUNTER — Emergency Department (HOSPITAL_COMMUNITY): Payer: BC Managed Care – PPO

## 2020-06-30 ENCOUNTER — Ambulatory Visit (HOSPITAL_COMMUNITY)
Admission: RE | Admit: 2020-06-30 | Discharge: 2020-06-30 | Disposition: A | Discharge status: Home / Self Care | Payer: BC Managed Care – PPO | Source: Ambulatory Visit | Attending: Radiation Oncology | Admitting: Radiation Oncology

## 2020-06-30 ENCOUNTER — Other Ambulatory Visit: Payer: Self-pay

## 2020-06-30 ENCOUNTER — Encounter (HOSPITAL_COMMUNITY): Payer: Self-pay

## 2020-06-30 ENCOUNTER — Encounter (HOSPITAL_COMMUNITY): Payer: Self-pay | Admitting: Emergency Medicine

## 2020-06-30 DIAGNOSIS — C7A1 Malignant poorly differentiated neuroendocrine tumors: Secondary | ICD-10-CM | POA: Diagnosis not present

## 2020-06-30 DIAGNOSIS — A419 Sepsis, unspecified organism: Secondary | ICD-10-CM | POA: Diagnosis present

## 2020-06-30 DIAGNOSIS — L8990 Pressure ulcer of unspecified site, unspecified stage: Secondary | ICD-10-CM | POA: Insufficient documentation

## 2020-06-30 DIAGNOSIS — C3402 Malignant neoplasm of left main bronchus: Secondary | ICD-10-CM

## 2020-06-30 DIAGNOSIS — E860 Dehydration: Secondary | ICD-10-CM | POA: Diagnosis present

## 2020-06-30 DIAGNOSIS — F419 Anxiety disorder, unspecified: Secondary | ICD-10-CM | POA: Diagnosis present

## 2020-06-30 DIAGNOSIS — N400 Enlarged prostate without lower urinary tract symptoms: Secondary | ICD-10-CM | POA: Diagnosis present

## 2020-06-30 DIAGNOSIS — W19XXXA Unspecified fall, initial encounter: Secondary | ICD-10-CM | POA: Diagnosis present

## 2020-06-30 DIAGNOSIS — E162 Hypoglycemia, unspecified: Secondary | ICD-10-CM | POA: Diagnosis present

## 2020-06-30 DIAGNOSIS — Z515 Encounter for palliative care: Secondary | ICD-10-CM | POA: Diagnosis not present

## 2020-06-30 DIAGNOSIS — I2510 Atherosclerotic heart disease of native coronary artery without angina pectoris: Secondary | ICD-10-CM | POA: Diagnosis present

## 2020-06-30 DIAGNOSIS — B370 Candidal stomatitis: Secondary | ICD-10-CM | POA: Diagnosis present

## 2020-06-30 DIAGNOSIS — J9601 Acute respiratory failure with hypoxia: Secondary | ICD-10-CM | POA: Diagnosis present

## 2020-06-30 DIAGNOSIS — F17210 Nicotine dependence, cigarettes, uncomplicated: Secondary | ICD-10-CM | POA: Diagnosis not present

## 2020-06-30 DIAGNOSIS — C799 Secondary malignant neoplasm of unspecified site: Secondary | ICD-10-CM | POA: Diagnosis present

## 2020-06-30 DIAGNOSIS — J690 Pneumonitis due to inhalation of food and vomit: Secondary | ICD-10-CM | POA: Diagnosis present

## 2020-06-30 DIAGNOSIS — J309 Allergic rhinitis, unspecified: Secondary | ICD-10-CM | POA: Diagnosis present

## 2020-06-30 DIAGNOSIS — C3412 Malignant neoplasm of upper lobe, left bronchus or lung: Secondary | ICD-10-CM | POA: Insufficient documentation

## 2020-06-30 DIAGNOSIS — R1310 Dysphagia, unspecified: Secondary | ICD-10-CM | POA: Diagnosis present

## 2020-06-30 DIAGNOSIS — E875 Hyperkalemia: Secondary | ICD-10-CM | POA: Diagnosis present

## 2020-06-30 DIAGNOSIS — C7951 Secondary malignant neoplasm of bone: Secondary | ICD-10-CM

## 2020-06-30 DIAGNOSIS — Z20822 Contact with and (suspected) exposure to covid-19: Secondary | ICD-10-CM | POA: Diagnosis present

## 2020-06-30 DIAGNOSIS — C787 Secondary malignant neoplasm of liver and intrahepatic bile duct: Secondary | ICD-10-CM | POA: Diagnosis present

## 2020-06-30 DIAGNOSIS — Z801 Family history of malignant neoplasm of trachea, bronchus and lung: Secondary | ICD-10-CM

## 2020-06-30 DIAGNOSIS — R64 Cachexia: Secondary | ICD-10-CM | POA: Diagnosis present

## 2020-06-30 DIAGNOSIS — Z66 Do not resuscitate: Secondary | ICD-10-CM | POA: Diagnosis not present

## 2020-06-30 DIAGNOSIS — C7931 Secondary malignant neoplasm of brain: Secondary | ICD-10-CM | POA: Diagnosis present

## 2020-06-30 DIAGNOSIS — Z885 Allergy status to narcotic agent status: Secondary | ICD-10-CM

## 2020-06-30 DIAGNOSIS — Z681 Body mass index (BMI) 19 or less, adult: Secondary | ICD-10-CM

## 2020-06-30 DIAGNOSIS — I10 Essential (primary) hypertension: Secondary | ICD-10-CM | POA: Diagnosis present

## 2020-06-30 DIAGNOSIS — M9985 Other biomechanical lesions of pelvic region: Secondary | ICD-10-CM | POA: Diagnosis not present

## 2020-06-30 DIAGNOSIS — R296 Repeated falls: Secondary | ICD-10-CM | POA: Diagnosis present

## 2020-06-30 DIAGNOSIS — E785 Hyperlipidemia, unspecified: Secondary | ICD-10-CM | POA: Diagnosis present

## 2020-06-30 DIAGNOSIS — Z7952 Long term (current) use of systemic steroids: Secondary | ICD-10-CM

## 2020-06-30 DIAGNOSIS — I700 Atherosclerosis of aorta: Secondary | ICD-10-CM | POA: Diagnosis present

## 2020-06-30 DIAGNOSIS — Z79899 Other long term (current) drug therapy: Secondary | ICD-10-CM

## 2020-06-30 DIAGNOSIS — M7989 Other specified soft tissue disorders: Secondary | ICD-10-CM | POA: Diagnosis not present

## 2020-06-30 DIAGNOSIS — R0602 Shortness of breath: Secondary | ICD-10-CM

## 2020-06-30 DIAGNOSIS — Z85828 Personal history of other malignant neoplasm of skin: Secondary | ICD-10-CM

## 2020-06-30 DIAGNOSIS — R531 Weakness: Secondary | ICD-10-CM

## 2020-06-30 DIAGNOSIS — E43 Unspecified severe protein-calorie malnutrition: Secondary | ICD-10-CM | POA: Insufficient documentation

## 2020-06-30 DIAGNOSIS — C7952 Secondary malignant neoplasm of bone marrow: Secondary | ICD-10-CM | POA: Insufficient documentation

## 2020-06-30 DIAGNOSIS — R000 Tachycardia, unspecified: Secondary | ICD-10-CM | POA: Diagnosis not present

## 2020-06-30 DIAGNOSIS — K219 Gastro-esophageal reflux disease without esophagitis: Secondary | ICD-10-CM | POA: Diagnosis present

## 2020-06-30 LAB — CBC WITH DIFFERENTIAL/PLATELET
Abs Immature Granulocytes: 0.07 10*3/uL (ref 0.00–0.07)
Basophils Absolute: 0 10*3/uL (ref 0.0–0.1)
Basophils Relative: 0 %
Eosinophils Absolute: 0 10*3/uL (ref 0.0–0.5)
Eosinophils Relative: 0 %
HCT: 41.1 % (ref 39.0–52.0)
Hemoglobin: 14 g/dL (ref 13.0–17.0)
Immature Granulocytes: 1 %
Lymphocytes Relative: 3 %
Lymphs Abs: 0.2 10*3/uL — ABNORMAL LOW (ref 0.7–4.0)
MCH: 29 pg (ref 26.0–34.0)
MCHC: 34.1 g/dL (ref 30.0–36.0)
MCV: 85.1 fL (ref 80.0–100.0)
Monocytes Absolute: 0.7 10*3/uL (ref 0.1–1.0)
Monocytes Relative: 8 %
Neutro Abs: 8.2 10*3/uL — ABNORMAL HIGH (ref 1.7–7.7)
Neutrophils Relative %: 88 %
Platelets: 330 10*3/uL (ref 150–400)
RBC: 4.83 MIL/uL (ref 4.22–5.81)
RDW: 14 % (ref 11.5–15.5)
WBC: 9.2 10*3/uL (ref 4.0–10.5)
nRBC: 0 % (ref 0.0–0.2)

## 2020-06-30 LAB — COMPREHENSIVE METABOLIC PANEL
ALT: 33 U/L (ref 0–44)
AST: 132 U/L — ABNORMAL HIGH (ref 15–41)
Albumin: 2.6 g/dL — ABNORMAL LOW (ref 3.5–5.0)
Alkaline Phosphatase: 362 U/L — ABNORMAL HIGH (ref 38–126)
Anion gap: 15 (ref 5–15)
BUN: 37 mg/dL — ABNORMAL HIGH (ref 8–23)
CO2: 23 mmol/L (ref 22–32)
Calcium: 10.6 mg/dL — ABNORMAL HIGH (ref 8.9–10.3)
Chloride: 97 mmol/L — ABNORMAL LOW (ref 98–111)
Creatinine, Ser: 0.67 mg/dL (ref 0.61–1.24)
GFR calc Af Amer: >60 mL/min (ref >60)
GFR calc non Af Amer: >60 mL/min (ref >60)
Glucose, Bld: 71 mg/dL (ref 70–99)
Potassium: 6.2 mmol/L — ABNORMAL HIGH (ref 3.5–5.1)
Sodium: 135 mmol/L (ref 135–145)
Total Bilirubin: 2 mg/dL — ABNORMAL HIGH (ref 0.3–1.2)
Total Protein: 5.6 g/dL — ABNORMAL LOW (ref 6.5–8.1)

## 2020-06-30 LAB — URINALYSIS, ROUTINE W REFLEX MICROSCOPIC
Bilirubin Urine: NEGATIVE
Glucose, UA: NEGATIVE mg/dL
Ketones, ur: 5 mg/dL — AB
Leukocytes,Ua: NEGATIVE
Nitrite: NEGATIVE
Protein, ur: NEGATIVE mg/dL
Specific Gravity, Urine: 1.017 (ref 1.005–1.030)
pH: 5 (ref 5.0–8.0)

## 2020-06-30 LAB — PROTIME-INR
INR: 1.1 (ref 0.8–1.2)
Prothrombin Time: 14.1 seconds (ref 11.4–15.2)

## 2020-06-30 LAB — SARS CORONAVIRUS 2 BY RT PCR (HOSPITAL ORDER, PERFORMED IN ~~LOC~~ HOSPITAL LAB): SARS Coronavirus 2: NEGATIVE

## 2020-06-30 MED ORDER — SODIUM CHLORIDE 0.9 % IV BOLUS
1000.0000 mL | Freq: Once | INTRAVENOUS | Status: AC
  Administered 2020-06-30: 1000 mL via INTRAVENOUS

## 2020-06-30 MED ORDER — MORPHINE SULFATE (PF) 2 MG/ML IV SOLN
1.0000 mg | INTRAVENOUS | Status: DC | PRN
  Administered 2020-06-30 – 2020-07-02 (×3): 1 mg via INTRAVENOUS
  Filled 2020-06-30 (×3): qty 1

## 2020-06-30 MED ORDER — PANTOPRAZOLE SODIUM 40 MG PO TBEC: 40.0000 mg | DELAYED_RELEASE_TABLET | Freq: Every day | ORAL | Status: DC

## 2020-06-30 MED ORDER — ACETAMINOPHEN 650 MG RE SUPP
650.0000 mg | Freq: Four times a day (QID) | RECTAL | Status: DC | PRN
  Administered 2020-07-01: 650 mg via RECTAL
  Filled 2020-06-30: qty 1

## 2020-06-30 MED ORDER — LIDOCAINE HCL (PF) 1 % IJ SOLN
INTRAMUSCULAR | Status: AC | PRN
  Administered 2020-06-30: 10 mL via INTRADERMAL

## 2020-06-30 MED ORDER — ALPRAZOLAM 0.25 MG PO TABS
0.2500 mg | ORAL_TABLET | Freq: Every day | ORAL | Status: DC | PRN
  Administered 2020-06-30: 0.25 mg via ORAL
  Filled 2020-06-30: qty 1

## 2020-06-30 MED ORDER — SODIUM CHLORIDE 0.9 % IV SOLN: INTRAVENOUS | Status: DC

## 2020-06-30 MED ORDER — SODIUM ZIRCONIUM CYCLOSILICATE 10 G PO PACK
10.0000 g | PACK | Freq: Once | ORAL | Status: AC
  Administered 2020-06-30: 10 g via ORAL
  Filled 2020-06-30: qty 1

## 2020-06-30 MED ORDER — ENOXAPARIN SODIUM 40 MG/0.4ML ~~LOC~~ SOLN
40.0000 mg | SUBCUTANEOUS | Status: DC
  Administered 2020-06-30 – 2020-07-01 (×2): 40 mg via SUBCUTANEOUS
  Filled 2020-06-30 (×2): qty 0.4

## 2020-06-30 MED ORDER — MIDAZOLAM HCL 2 MG/2ML IJ SOLN
INTRAMUSCULAR | Status: AC
  Filled 2020-06-30: qty 4

## 2020-06-30 MED ORDER — FENTANYL CITRATE (PF) 100 MCG/2ML IJ SOLN
INTRAMUSCULAR | Status: AC
  Filled 2020-06-30: qty 4

## 2020-06-30 MED ORDER — FENTANYL CITRATE (PF) 100 MCG/2ML IJ SOLN
INTRAMUSCULAR | Status: AC | PRN
  Administered 2020-06-30: 50 ug via INTRAVENOUS

## 2020-06-30 MED ORDER — FLUCONAZOLE 100 MG PO TABS
100.0000 mg | ORAL_TABLET | Freq: Every day | ORAL | Status: DC
  Administered 2020-06-30: 100 mg via ORAL
  Filled 2020-06-30 (×2): qty 1

## 2020-06-30 MED ORDER — MIDAZOLAM HCL 2 MG/2ML IJ SOLN
INTRAMUSCULAR | Status: AC | PRN
  Administered 2020-06-30: 1 mg via INTRAVENOUS

## 2020-06-30 MED ORDER — NALOXONE HCL 0.4 MG/ML IJ SOLN
INTRAMUSCULAR | Status: AC
  Filled 2020-06-30: qty 1

## 2020-06-30 MED ORDER — ACETAMINOPHEN 325 MG PO TABS: 650.0000 mg | ORAL_TABLET | Freq: Four times a day (QID) | ORAL | Status: DC | PRN

## 2020-06-30 MED ORDER — FLUMAZENIL 0.5 MG/5ML IV SOLN
INTRAVENOUS | Status: AC
  Filled 2020-06-30: qty 5

## 2020-06-30 MED ORDER — OXYCODONE HCL 5 MG PO TABS
5.0000 mg | ORAL_TABLET | Freq: Four times a day (QID) | ORAL | Status: DC | PRN
  Administered 2020-07-01 (×2): 5 mg via ORAL
  Filled 2020-06-30 (×3): qty 1

## 2020-06-30 NOTE — Telephone Encounter (Signed)
Received call from patient's wife stating patient fell during the night.  Patient had gotten up to use the bathroom, did not use his walker, and fell to his knees. Patient's wife was unable to lift patient up and had to call the fire department for assistance.  Patient was not taken to ED for further evaluation as wife stated the patient did not have any evidence of trauma. Patient's wife concerned that patient has become progressively weaker since being seen in office yesterday.  Patient is currently in IR for a biopsy but patient's wife wants to know if patient can be admitted to hospital due to her concerns. [name] Heilingoepter, PA made aware. Per [name], patient should be taken to ED after completion of biopsy to be evaluated.  Informed patient's wife and she verbalized understanding.  I also clarified with patient's wife to have patient hold his cholchicine this week while he is taking diflucan. Patient's wife states patient has not been taking the cholchicine and has not needed it for some time.   Patient's wife instructed to call office with any additional questions or concerns.

## 2020-06-30 NOTE — ED Provider Notes (Signed)
Todd Garner Provider Note  CSN: [id number] Arrival date & time: 07/04/2020 1151    History Chief Complaint  Patient presents with  . Fall  . Fatigue    HPI  Todd Garner is a 64 y.o. male brought to the ED from Short Stay where he was getting an outpatient CT guided biopsy of a lesion in his L sacral area. He was initially found to have lung mass with presumed mets in liver and L sacrum a few weeks ago. Has been seen by Oncology including yesterday when he was noted to have hypercalcemia, given IVF and Zometa. He has also been seen by Rad Onc and planning palliative radiation starting next week. Further treatment discussions are pending tissue diagnosis, MRI Brain and PET scan for staging. Wife provides much of the history today. She reports he has been increasingly weak at home. He had a fall last night trying to get to the bathroom, Fire Dept had to come help him back to bed. Wife had to call Fire Dept again this morning to help get him into the car to bring him to the hospital for his procedure. He has not been eating or drinking much at all recently. He has been more somnolent, confused about details of the day. Not talking much. Given Diflucan for thrush yesterday. Wife is concerned she is no longer able to care for him at home in his current condition.    Past Medical History:  Diagnosis Date  . Allergic rhinitis   . Anal condylomata   . Anxiety   . Cancer ([place]) 05/2020   widespread (lung, liver, abd nodes, pelvis, extending into glut soft tissues-->no tissue dx as of 06/17/20-->referred to onc 06/17/20.  Presented as low back pain and abnormal weight loss.  Marland Kitchen GERD (gastroesophageal reflux disease)   . Gout   . HTN (hypertension)   . Hyperlipidemia 2017   Improved with TLC 11/2016  . IFG (impaired fasting glucose) 2016   A1c 5.2% 05/2015  . Palpable abdominal aorta 06/28/2011   Aortic u/s 07/03/11 showed NO AAA.  . Tobacco dependence    Trial of chantix  07/2017.  Ongoing, although much less, as of 07/2018.    Past Surgical History:  Procedure Laterality Date  . anal wart excision    . ESOPHAGOGASTRODUODENOSCOPY  approx mid 1990s   pt reports normal.  . SKIN CANCER EXCISION  05/2011   SSC from left side of face/neck    Family History  Problem Relation Age of Onset  . Hypertension Mother   . Cancer Father 40       lung cancer  . Hypertension Father   . Hypertension Paternal Grandfather     Social History   Tobacco Use  . Smoking status: Current Every Day Smoker    Packs/day: 0.50    Types: Cigarettes  . Smokeless tobacco: Never Used  Substance Use Topics  . Alcohol use: Yes    Comment: beer 2-3 nights week  . Drug use: No     Home Medications Prior to Admission medications   Medication Sig Start Date End Date Taking? Authorizing Provider  ALPRAZolam Duanne Moron) 0.5 MG tablet TAKE 1-2 TABLETS BY MOUTH TWICE A DAY AS NEEDED 04/28/20   McGowen, Adrian Blackwater, MD  amLODipine-olmesartan (AZOR) 10-40 MG tablet TAKE 1 TABLET BY MOUTH EVERY DAY 04/26/20   McGowen, Adrian Blackwater, MD  baclofen (LIORESAL) 10 MG tablet Take 0.5-1 tablets (5-10 mg total) by mouth 3 (three) times daily as  needed for muscle spasms. Patient not taking: Reported on 06/08/2020 04/21/20   Hilts, Legrand Como, MD  colchicine 0.6 MG tablet 2 tabs po at onset of gout flare, then 1 tab 2 hours later.  Then 1 tab po bid beginning the following day until no further pain Patient not taking: Reported on 06/08/2020 09/09/15   Tammi Sou, MD  fluconazole (DIFLUCAN) 100 MG tablet Take 1 tablet (100 mg total) by mouth daily. 06/29/20   Heilingoetter, Cassandra L, PA-C  fluticasone (FLONASE) 50 MCG/ACT nasal spray 2 sprays in each nostril once daily for nasal allergy symptoms 08/15/17   McGowen, Adrian Blackwater, MD  oxyCODONE (OXY IR/ROXICODONE) 5 MG immediate release tablet 1-2 tabs po q6h prn pain 06/18/20   McGowen, Adrian Blackwater, MD  pantoprazole (PROTONIX) 40 MG tablet TAKE 1 TABLET BY MOUTH EVERY DAY  06/29/20   McGowen, Adrian Blackwater, MD  predniSONE (DELTASONE) 10 MG tablet Take as directed for 12 days.  Daily dose 6,6,5,5,4,4,3,3,2,2,1,1. Patient not taking: Reported on 06/08/2020 04/21/20   Hilts, Michael, MD     Allergies    Codeine   Review of Systems   Review of Systems Unable to assess due to mental status.    Physical Exam BP 131/68   Pulse (!) 109   Temp 98.7 F (37.1 C) (Oral)   Resp 17   Ht 5\' 11"  (1.803 m)   Wt 56.7 kg   SpO2 (!) 86%   BMI 17.43 kg/m   Physical Exam Vitals and nursing note reviewed.  Constitutional:      Comments: Somnolent but arousable, cachectic  HENT:     Head: Normocephalic and atraumatic.     Nose: Nose normal.     Mouth/Throat:     Mouth: Mucous membranes are dry.  Eyes:     Extraocular Movements: Extraocular movements intact.     Conjunctiva/sclera: Conjunctivae normal.  Cardiovascular:     Rate and Rhythm: Normal rate.  Pulmonary:     Effort: Pulmonary effort is normal.     Breath sounds: Rhonchi present.  Abdominal:     General: Abdomen is flat.     Palpations: Abdomen is soft.     Tenderness: There is no abdominal tenderness.  Musculoskeletal:        General: No swelling. Normal range of motion.     Cervical back: Neck supple.  Skin:    General: Skin is warm and dry.  Neurological:     General: No focal deficit present.     Comments: Somnolent  Psychiatric:     Comments: Difficult to assess      ED Results / Procedures / Treatments   Labs (all labs ordered are listed, but only abnormal results are displayed) Labs Reviewed  COMPREHENSIVE METABOLIC PANEL - Abnormal; Notable for the following components:      Result Value   Potassium 6.2 (*)    Chloride 97 (*)    BUN 37 (*)    Calcium 10.6 (*)    Total Protein 5.6 (*)    Albumin 2.6 (*)    AST 132 (*)    Alkaline Phosphatase 362 (*)    Total Bilirubin 2.0 (*)    All other components within normal limits  SARS CORONAVIRUS 2 BY RT PCR (HOSPITAL ORDER, [facility])  URINALYSIS, ROUTINE W REFLEX MICROSCOPIC    EKG EKG Interpretation  Date/Time:  Wednesday June 30 2020 12:54:19 EDT Ventricular Rate:  94 PR Interval:    QRS Duration:  71 QT Interval:  340 QTC Calculation: 426 R Axis:   92 Text Interpretation: Sinus rhythm Right axis deviation Low voltage, precordial leads No old tracing to compare Confirmed by Calvert Cantor [phone]) on 07/02/2020 1:05:56 PM   Radiology DG Chest 1 View  Result Date: 06/24/2020 CLINICAL DATA:  Cough and shortness of breath. Recent diagnosis of metastatic lung cancer. EXAM: CHEST  1 VIEW COMPARISON:  Chest CT 06/17/2020 FINDINGS: The heart is normal in size. Stable appearing left hilar mass. No acute superimposed pulmonary findings such as pleural effusion or pneumothorax. No pulmonary edema. The bony thorax is grossly intact. IMPRESSION: 1. Stable left hilar mass. 2. No acute pulmonary findings. Electronically Signed   By: Marijo Sanes M.D.   On: 07/14/2020 12:41   CT Head Wo Contrast  Result Date: 07/01/2020 CLINICAL DATA:  Altered mental status.  Metastatic lung cancer. EXAM: CT HEAD WITHOUT CONTRAST TECHNIQUE: Contiguous axial images were obtained from the base of the skull through the vertex without intravenous contrast. COMPARISON:  None. FINDINGS: Brain: The brain itself has a normal appearance without evidence of old or acute infarction, mass lesion, hemorrhage, hydrocephalus or extra-axial collection. In the left frontal subarachnoid space, there is a vascular calcification not felt to be of clinical significance. Vascular: There is atherosclerotic calcification of the major vessels at the base of the brain. Skull: No calvarial lesion is seen. There is a lytic metastasis at the skull base on the right with involvement of the occipital condyle and C1 vertebra. There is a soft tissue mass associated with this measuring over 5 cm in size and with potential to affect the right vertebral  artery. Extradural tumor within the right lateral aspect the foramen magnum but without apparent compression of the brainstem. Sinuses/Orbits: Clear/normal Other: None IMPRESSION: Normal appearance of the brain itself. Metastatic disease affecting the skull base and C1 vertebra on the right with a soft tissue mass measuring over 5 cm in size. Potential for compromise of the right vertebral artery because of this. Extradural tumor encroachment upon the right side of the foramen magnum but without brainstem compression Electronically Signed   By: [name] Chimes M.D.   On: 07/04/2020 13:12   CT Biopsy  Result Date: 07/15/2020 INDICATION: Advanced metastatic disease of uncertain primary etiology. Imaging findings are suspicious for small cell lung cancer. EXAM: CT-guided biopsy MEDICATIONS: None. ANESTHESIA/SEDATION: Moderate (conscious) sedation was employed during this procedure. A total of Versed 1 mg and Fentanyl 50 mcg was administered intravenously. Moderate Sedation Time: 10 minutes. The patient's level of consciousness and vital signs were monitored continuously by radiology nursing throughout the procedure under my direct supervision. FLUOROSCOPY TIME:  None COMPLICATIONS: None immediate. PROCEDURE: Informed written consent was obtained from the patient after a thorough discussion of the procedural risks, benefits and alternatives. All questions were addressed. Maximal Sterile Barrier Technique was utilized including caps, mask, sterile gowns, sterile gloves, sterile drape, hand hygiene and skin antiseptic. A timeout was performed prior to the initiation of the procedure. A planning axial CT scan was performed. The large exophytic soft tissue mass arising from the left iliac bone was successfully identified. The overlying skin was sterilely prepped and draped in the standard fashion using chlorhexidine skin prep. Local anesthesia was attained by infiltration with 1% lidocaine. A small dermatotomy was made. A  17 gauge introducer needle was advanced into the margin of the mass. Multiple 18 gauge core biopsies were then obtained coaxially using the bio Pince automated biopsy device. Biopsy specimens were  placed in formalin and delivered to pathology for further analysis. IMPRESSION: Successful CT-guided core biopsy of exophytic left iliac mass. Electronically Signed   By: Jacqulynn Cadet M.D.   On: 06/24/2020 10:41    Procedures .Critical Care Performed by: Truddie Hidden, MD Authorized by: Truddie Hidden, MD   Critical care provider statement:    Critical care time (minutes):  45   Critical care time was exclusive of:  Separately billable procedures and treating other patients   Critical care was necessary to treat or prevent imminent or life-threatening deterioration of the following conditions:  Endocrine crisis   Critical care was time spent personally by me on the following activities:  Discussions with consultants, evaluation of patient's response to treatment, examination of patient, ordering and performing treatments and interventions, ordering and review of laboratory studies, ordering and review of radiographic studies, pulse oximetry, re-evaluation of patient's condition, obtaining history from patient or surrogate, review of old charts and development of treatment plan with patient or surrogate    Medications Ordered in the ED Medications  sodium zirconium cyclosilicate (LOKELMA) packet 10 g (has no administration in time range)  sodium chloride 0.9 % bolus 1,000 mL (1,000 mLs Intravenous New Bag/Given 07/15/2020 1305)     MDM Rules/Calculators/A&P MDM Patient here with increasing generalized weakness and falls since diagnosis with lung cancer recently. Found to be hypercalcemic yesterday, those labs were reviewed, also mildly hyperkalemic. Will recheck today. CBC and INR done this morning at Short Stay reviewed, no concerning changes there. Also check head CT given no  neuroimaging yet done. IVF ordered. EKG, UA and CXR to complete ED workup.  ED Course  I have reviewed the triage vital signs and the nursing notes.  Pertinent labs & imaging results that were available during my care of the patient were reviewed by me and considered in my medical decision making (see chart for details).  Clinical Course as of Jun 30 1436  Wed Jun 30, 2020  1421 PAtient's head CT and CXR images and results reviewed.No emergent findings. Patient's CMP shows worsening K, Ca is improved. Will discuss admission with hospitalists.    [CS]  5701 Patient's Oxygen has been borderline low, will ask RN to place on 2L [place]. Covid swab ordered.    [CS]  7793 Spoke with Dr. Wyline Copas, Hospitalist, who agrees with plan for [facility] for hyperK and admission for further management.    [CS]    Clinical Course User Index [CS] Truddie Hidden, MD    Final Clinical Impression(s) / ED Diagnoses Final diagnoses:  Weakness  Hyperkalemia  Malignant neoplasm of hilus of left lung [facility])    Rx / DC Orders ED Discharge Orders    None       Truddie Hidden, MD 07/05/2020 1437

## 2020-06-30 NOTE — Discharge Instructions (Signed)
Urgent needs - IR on call MD [phone]  Wound - May remove dressing and shower in 24 to 48 hours.  Keep site clean and dry.  Replace with bandaid. Do not submerge in tub or water until site healing well.  I  Bone Marrow Aspiration and Bone Marrow Biopsy, Adult, Care After This sheet gives you information about how to care for yourself after your procedure. Your health care provider may also give you more specific instructions. If you have problems or questions, contact your health care provider. What can I expect after the procedure? After the procedure, it is common to have:  Mild pain and tenderness.  Swelling.  Bruising. Follow these instructions at home: Puncture site care  Follow instructions from your health care provider about how to take care of the puncture site. Make sure you: ? Wash your hands with soap and water before and after you change your bandage (dressing). If soap and water are not available, use hand sanitizer. ? Change your dressing as told by your health care provider.  Check your puncture site every day for signs of infection. Check for: ? More redness, swelling, or pain. ? Fluid or blood. ? Warmth. ? Pus or a bad smell. Activity  Return to your normal activities as told by your health care provider. Ask your health care provider what activities are safe for you.  Do not lift anything that is heavier than 10 lb (4.5 kg), or the limit that you are told, until your health care provider says that it is safe.  Do not drive for 24 hours if you were given a sedative during your procedure. General instructions  Take over-the-counter and prescription medicines only as told by your health care provider.  Do not take baths, swim, or use a hot tub until your health care provider approves. Ask your health care provider if you may take showers. You may only be allowed to take sponge baths.  If directed, put ice on the affected area. To do this: ? Put ice in a  plastic bag. ? Place a towel between your skin and the bag. ? Leave the ice on for 20 minutes, 2-3 times a day.  Keep all follow-up visits as told by your health care provider. This is important. Contact a health care provider if:  Your pain is not controlled with medicine.  You have a fever.  You have more redness, swelling, or pain around the puncture site.  You have fluid or blood coming from the puncture site.  Your puncture site feels warm to the touch.  You have pus or a bad smell coming from the puncture site. Summary  After the procedure, it is common to have mild pain, tenderness, swelling, and bruising.  Follow instructions from your health care provider about how to take care of the puncture site and what activities are safe for you.  Take over-the-counter and prescription medicines only as told by your health care provider.  Contact a health care provider if you have any signs of infection, such as fluid or blood coming from the puncture site. This information is not intended to replace advice given to you by your health care provider. Make sure you discuss any questions you have with your health care provider. Document Revised: 04/22/2019 Document Reviewed: 04/22/2019 Elsevier Patient Education  [place].    Moderate Conscious Sedation, Adult, Care After These instructions provide you with information about caring for yourself after your procedure. Your health care provider  may also give you more specific instructions. Your treatment has been planned according to current medical practices, but problems sometimes occur. Call your health care provider if you have any problems or questions after your procedure. What can I expect after the procedure? After your procedure, it is common:  To feel sleepy for several hours.  To feel clumsy and have poor balance for several hours.  To have poor judgment for several hours.  To vomit if you eat too  soon. Follow these instructions at home: For at least 24 hours after the procedure:   Do not: ? Participate in activities where you could fall or become injured. ? Drive. ? Use heavy machinery. ? Drink alcohol. ? Take sleeping pills or medicines that cause drowsiness. ? Make important decisions or sign legal documents. ? Take care of children on your own.  Rest. Eating and drinking  Follow the diet recommended by your health care provider.  If you vomit: ? Drink water, juice, or soup when you can drink without vomiting. ? Make sure you have little or no nausea before eating solid foods. General instructions  Have a responsible adult stay with you until you are awake and alert.  Take over-the-counter and prescription medicines only as told by your health care provider.  If you smoke, do not smoke without supervision.  Keep all follow-up visits as told by your health care provider. This is important. Contact a health care provider if:  You keep feeling nauseous or you keep vomiting.  You feel light-headed.  You develop a rash.  You have a fever. Get help right away if:  You have trouble breathing. This information is not intended to replace advice given to you by your health care provider. Make sure you discuss any questions you have with your health care provider. Document Revised: 11/16/2017 Document Reviewed: 03/25/2016 Elsevier Patient Education  2020 [employer].

## 2020-06-30 NOTE — Progress Notes (Addendum)
After consulting with Dr Laurence Ferrari and Pt's wife Kelli Egolf (who requests ED bed for Pt to be admitted) says "per Dr Julien Nordmann take Pt to ER".  ER bed 3 aquired and report called to nurse Davy Pique who will assume care post IR procedure.

## 2020-06-30 NOTE — Procedures (Signed)
Interventional Radiology Procedure Note  Procedure: CT guided biopsy of left iliac mass  Complications: None  Estimated Blood Loss: None  Recommendations: - DC home    Signed,  Criselda Peaches, MD

## 2020-06-30 NOTE — Consult Note (Signed)
Chief Complaint: Patient was seen in consultation today for CT-guided biopsy of pelvic bone lesion  Referring Physician(s): Moody,John/Mohamed,M  Supervising Physician: Jacqulynn Cadet  Patient Status: Todd Garner  History of Present Illness: Todd Garner is a 64 y.o. male smoker with history of persistent back pain, weight loss, weakness/fatigue, enlarged liver as well as palpable soft tissue mass left lower back/gluteal region and recent imaging which has revealed:  Large left hilar and central left upper lobe conglomerate mass most compatible with primary lung cancer. Associated mediastinal adenopathy.  Numerous large metastases throughout the liver measuring up to 11 cm.  Bulky porta hepatis and retroperitoneal/pelvic adenopathy.  Large bone metastases in the iliac bones bilaterally. These are associated with bone destruction and extension into the adjacent soft tissues.  Aortic atherosclerosis, coronary artery disease.  Prostate enlargement with central calcifications.  He has no prior history of malignancy.  He presents today for CT-guided pelvic bone lesion biopsy for further evaluation.  Past Medical History:  Diagnosis Date  . Allergic rhinitis   . Anal condylomata   . Anxiety   . Cancer ([place]) 05/2020   widespread (lung, liver, abd nodes, pelvis, extending into glut soft tissues-->no tissue dx as of 06/17/20-->referred to onc 06/17/20.  Presented as low back pain and abnormal weight loss.  Marland Kitchen GERD (gastroesophageal reflux disease)   . Gout   . HTN (hypertension)   . Hyperlipidemia 2017   Improved with TLC 11/2016  . IFG (impaired fasting glucose) 2016   A1c 5.2% 05/2015  . Palpable abdominal aorta 06/28/2011   Aortic u/s 07/03/11 showed NO AAA.  . Tobacco dependence    Trial of chantix 07/2017.  Ongoing, although much less, as of 07/2018.    Past Surgical History:  Procedure Laterality Date  . anal wart excision    . ESOPHAGOGASTRODUODENOSCOPY   approx mid 1990s   pt reports normal.  . SKIN CANCER EXCISION  05/2011   [facility]C from left side of face/neck    Allergies: Codeine  Medications: Prior to Admission medications   Medication Sig Start Date End Date Taking? Authorizing Provider  ALPRAZolam (XANAX) 0.5 MG tablet TAKE 1-2 TABLETS BY MOUTH TWICE A DAY AS NEEDED 04/28/20  Yes McGowen, Adrian Blackwater, MD  amLODipine-olmesartan (AZOR) 10-40 MG tablet TAKE 1 TABLET BY MOUTH EVERY DAY 04/26/20  Yes McGowen, Adrian Blackwater, MD  baclofen (LIORESAL) 10 MG tablet Take 0.5-1 tablets (5-10 mg total) by mouth 3 (three) times daily as needed for muscle spasms. Patient not taking: Reported on 06/08/2020 04/21/20   Hilts, Legrand Como, MD  colchicine 0.6 MG tablet 2 tabs po at onset of gout flare, then 1 tab 2 hours later.  Then 1 tab po bid beginning the following day until no further pain Patient not taking: Reported on 06/08/2020 09/09/15   Tammi Sou, MD  fluconazole (DIFLUCAN) 100 MG tablet Take 1 tablet (100 mg total) by mouth daily. 06/29/20   Heilingoetter, Cassandra L, PA-C  fluticasone (FLONASE) 50 MCG/ACT nasal spray 2 sprays in each nostril once daily for nasal allergy symptoms 08/15/17   McGowen, Adrian Blackwater, MD  oxyCODONE (OXY IR/ROXICODONE) 5 MG immediate release tablet 1-2 tabs po q6h prn pain 06/18/20   McGowen, Adrian Blackwater, MD  pantoprazole (PROTONIX) 40 MG tablet TAKE 1 TABLET BY MOUTH EVERY DAY 06/29/20   McGowen, Adrian Blackwater, MD  predniSONE (DELTASONE) 10 MG tablet Take as directed for 12 days.  Daily dose 6,6,5,5,4,4,3,3,2,2,1,1. Patient not taking: Reported on 06/08/2020 04/21/20  Hilts, Michael, MD     Family History  Problem Relation Age of Onset  . Hypertension Mother   . Cancer Father 62       lung cancer  . Hypertension Father   . Hypertension Paternal Grandfather     Social History   Socioeconomic History  . Marital status: Married    Spouse name: Tammy  . Number of children: 1  . Years of education: Not on file  . Highest education  level: Not on file  Occupational History  . Not on file  Tobacco Use  . Smoking status: Current Every Day Smoker    Packs/day: 0.50    Types: Cigarettes  . Smokeless tobacco: Never Used  Substance and Sexual Activity  . Alcohol use: Yes    Comment: beer 2-3 nights week  . Drug use: No  . Sexual activity: Not on file  Other Topics Concern  . Not on file  Social History Narrative   Married, one daughter who is a Research officer, political party.   Has lived his entire life in [place], [place].   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   Ongoing tobacco dependence.    Drinks 3-4 beers several nights per week.  No hx of alc abuse or drug use.   No exercise.   Social Determinants of Health   Financial Resource Strain:   . Difficulty of Paying Living Expenses:   Food Insecurity:   . Worried About Charity fundraiser in the Last Year:   . Arboriculturist in the Last Year:   Transportation Needs:   . Film/video editor (Medical):   Marland Kitchen Lack of Transportation (Non-Medical):   Physical Activity:   . Days of Exercise per Week:   . Minutes of Exercise per Session:   Stress:   . Feeling of Stress :   Social Connections:   . Frequency of Communication with Friends and Family:   . Frequency of Social Gatherings with Friends and Family:   . Attends Religious Services:   . Active Member of Clubs or Organizations:   . Attends Archivist Meetings:   Marland Kitchen Marital Status:      Review of Systems see above: Denies fever, headache, chest pain, worsening dyspnea, cough, abdominal pain, nausea, vomiting or bleeding.  Vital Signs: BP (!) 103/56   Pulse 100   Temp 99 F (37.2 C) (Oral)   Resp 18   SpO2 96%   Physical Exam: Drowsy but arousable, appears weak.  Chest with distant breath sounds bilaterally.  Heart with regular rate and rhythm.  Abdomen soft, hepatomegaly noted, positive bowel sounds, currently nontender.  Palpable soft tissue mass left upper gluteal  region, tender to palpation.  No lower extremity edema;right cervical/left supraclavicular lymphadenopathy Imaging: CT Chest W Contrast  Result Date: 06/17/2020 CLINICAL DATA:  Abnormal weight loss, smoker, hepatomegaly on exam. Palpable soft tissue mass and left lower back gluteal region. EXAM: CT CHEST, ABDOMEN, AND PELVIS WITH CONTRAST TECHNIQUE: Multidetector CT imaging of the chest, abdomen and pelvis was performed following the standard protocol during bolus administration of intravenous contrast. CONTRAST:  142mL ISOVUE-300 IOPAMIDOL (ISOVUE-300) INJECTION 61% COMPARISON:  None. FINDINGS: CT CHEST FINDINGS Cardiovascular: Heart is normal size. Aorta is normal caliber. Coronary artery and aortic calcifications. No aneurysm. Mediastinum/Nodes: Large conglomerate mass in the left hilum and left upper lobe measuring 6.8 x 4.5 cm. AP window conglomerate adenopathy measures up to 2 cm in short axis diameter.  Subcarinal adenopathy. No right hilar or axillary adenopathy. Lungs/Pleura: Centrilobular emphysema. Conglomerate central left upper lobe and left hilar nodal mass again noted as measured above. Remainder the lungs are clear. No effusions. Musculoskeletal: Chest wall soft tissues are unremarkable. CT ABDOMEN PELVIS FINDINGS Hepatobiliary: Large masses throughout the liver compatible with metastases. The largest measures 11 cm in the right hepatic lobe. Gallbladder unremarkable. Pancreas: No focal abnormality or ductal dilatation. Spleen: No focal abnormality.  Normal size. Adrenals/Urinary Tract: No adrenal abnormality. No focal renal abnormality. No stones or hydronephrosis. Urinary bladder is unremarkable. Stomach/Bowel: Normal appendix. Stomach, large and small bowel grossly unremarkable. Vascular/Lymphatic: Diffuse aortic atherosclerosis. No aneurysm. Bulky retroperitoneal adenopathy. Left periaortic lymph node has a short axis diameter of 2.6 cm. Bulky porta hepatis adenopathy with short axis diameter  3.1 cm. Bilateral iliac nodal adenopathy, left greater than right with index left iliac lymph node having a short axis diameter of 2.4 cm. Reproductive: Mildly prominent prostate with central calcifications. Other: No free fluid or free air. Musculoskeletal: Large left gluteal mass measuring up to 8.2 cm. This likely arises from the left iliac bone with bone destruction and extension into the gluteal soft tissues. This also extends across the left SI joint and involves the left sacrum. Large metastasis in the right iliac bone with soft tissue extension, measuring up to 4.3 cm. IMPRESSION: Large left hilar and central left upper lobe conglomerate mass most compatible with primary lung cancer. Associated mediastinal adenopathy. Numerous large metastases throughout the liver measuring up to 11 cm. Bulky porta hepatis and retroperitoneal/pelvic adenopathy. Large bone metastases in the iliac bones bilaterally. These are associated with bone destruction and extension into the adjacent soft tissues. Aortic atherosclerosis, coronary artery disease. Prostate enlargement with central calcifications. These results will be called to the ordering clinician or representative by the Radiologist Assistant, and communication documented in the PACS or [employer]. Electronically Signed   By: Rolm Baptise M.D.   On: 06/17/2020 15:29   CT Abdomen Pelvis W Contrast  Result Date: 06/17/2020 CLINICAL DATA:  Abnormal weight loss, smoker, hepatomegaly on exam. Palpable soft tissue mass and left lower back gluteal region. EXAM: CT CHEST, ABDOMEN, AND PELVIS WITH CONTRAST TECHNIQUE: Multidetector CT imaging of the chest, abdomen and pelvis was performed following the standard protocol during bolus administration of intravenous contrast. CONTRAST:  169mL ISOVUE-300 IOPAMIDOL (ISOVUE-300) INJECTION 61% COMPARISON:  None. FINDINGS: CT CHEST FINDINGS Cardiovascular: Heart is normal size. Aorta is normal caliber. Coronary artery and aortic  calcifications. No aneurysm. Mediastinum/Nodes: Large conglomerate mass in the left hilum and left upper lobe measuring 6.8 x 4.5 cm. AP window conglomerate adenopathy measures up to 2 cm in short axis diameter. Subcarinal adenopathy. No right hilar or axillary adenopathy. Lungs/Pleura: Centrilobular emphysema. Conglomerate central left upper lobe and left hilar nodal mass again noted as measured above. Remainder the lungs are clear. No effusions. Musculoskeletal: Chest wall soft tissues are unremarkable. CT ABDOMEN PELVIS FINDINGS Hepatobiliary: Large masses throughout the liver compatible with metastases. The largest measures 11 cm in the right hepatic lobe. Gallbladder unremarkable. Pancreas: No focal abnormality or ductal dilatation. Spleen: No focal abnormality.  Normal size. Adrenals/Urinary Tract: No adrenal abnormality. No focal renal abnormality. No stones or hydronephrosis. Urinary bladder is unremarkable. Stomach/Bowel: Normal appendix. Stomach, large and small bowel grossly unremarkable. Vascular/Lymphatic: Diffuse aortic atherosclerosis. No aneurysm. Bulky retroperitoneal adenopathy. Left periaortic lymph node has a short axis diameter of 2.6 cm. Bulky porta hepatis adenopathy with short axis diameter 3.1 cm. Bilateral iliac nodal  adenopathy, left greater than right with index left iliac lymph node having a short axis diameter of 2.4 cm. Reproductive: Mildly prominent prostate with central calcifications. Other: No free fluid or free air. Musculoskeletal: Large left gluteal mass measuring up to 8.2 cm. This likely arises from the left iliac bone with bone destruction and extension into the gluteal soft tissues. This also extends across the left SI joint and involves the left sacrum. Large metastasis in the right iliac bone with soft tissue extension, measuring up to 4.3 cm. IMPRESSION: Large left hilar and central left upper lobe conglomerate mass most compatible with primary lung cancer. Associated  mediastinal adenopathy. Numerous large metastases throughout the liver measuring up to 11 cm. Bulky porta hepatis and retroperitoneal/pelvic adenopathy. Large bone metastases in the iliac bones bilaterally. These are associated with bone destruction and extension into the adjacent soft tissues. Aortic atherosclerosis, coronary artery disease. Prostate enlargement with central calcifications. These results will be called to the ordering clinician or representative by the Radiologist Assistant, and communication documented in the PACS or [employer]. Electronically Signed   By: Rolm Baptise M.D.   On: 06/17/2020 15:29    Labs:  CBC: Recent Labs    12/01/19 1014 06/08/20 0904 06/29/20 0939 06/28/2020 0807  WBC 6.4 8.3 9.3 9.2  HGB 15.7 14.8 14.3 14.0  HCT 46.8 44.5 41.5 41.1  PLT 327.0 399.0 359 330    COAGS: No results for input(s): INR, APTT in the last 8760 hours.  BMP: Recent Labs    12/01/19 1014 06/08/20 0904 06/29/20 0939  NA 140 133* 130*  K 5.2 No hemolysis seen* 5.2 No hemolysis seen* 5.3*  CL 105 94* 94*  CO2 29 29 17*  GLUCOSE 94 96 73  BUN 15 18 35*  CALCIUM 9.2 10.9* 12.1*  CREATININE 0.91 0.72 0.81  GFRNONAA  --   --  >60  GFRAA  --   --  >60    LIVER FUNCTION TESTS: Recent Labs    12/01/19 1014 06/08/20 0904 06/29/20 0939  BILITOT 0.6 0.9 1.5*  AST 22 65* 113*  ALT 18 18 28   ALKPHOS 94 330* 459*  PROT 6.6 6.4 6.5  ALBUMIN 4.3 3.8 2.7*    TUMOR MARKERS: No results for input(s): AFPTM, CEA, CA199, CHROMGRNA in the last 8760 hours.  Assessment and Plan: 64 y.o. male smoker with history of persistent back pain, weight loss, weakness/fatigue, enlarged liver as well as palpable soft tissue mass left lower back/gluteal region and recent imaging which has revealed:  Large left hilar and central left upper lobe conglomerate mass most compatible with primary lung cancer. Associated mediastinal adenopathy.  Numerous large metastases throughout the  liver measuring up to 11 cm.  Bulky porta hepatis and retroperitoneal/pelvic adenopathy.  Large bone metastases in the iliac bones bilaterally. These are associated with bone destruction and extension into the adjacent soft tissues.  Aortic atherosclerosis, coronary artery disease.  Prostate enlargement with central calcifications.  He has no prior history of malignancy.  He presents today for CT-guided pelvic bone lesion biopsy for further evaluation.Risks and benefits of procedure was discussed with the patient/spouse including, but not limited to bleeding, infection, damage to adjacent structures or low yield requiring additional tests.  All of the questions were answered and there is agreement to proceed.  Consent signed and in chart.     Thank you for this interesting consult.  I greatly enjoyed meeting LORIS WINROW and look forward to participating in their care.  A copy of this report was sent to the requesting provider on this date.  Electronically Signed: D. Rowe Robert, PA-C 06/29/2020, 8:16 AM   I spent a total of 25 minutes  in face to face in clinical consultation, greater than 50% of which was counseling/coordinating care for CT guided pelvic bone lesion biopsy

## 2020-06-30 NOTE — H&P (Signed)
History and Physical    Todd Garner TTS:[id number] DOB: 11/06/1956 DOA: 06/22/2020  PCP: Todd Sou, MD  Patient coming from: Home  Chief Complaint: Falls  HPI: Todd Garner is a 64 y.o. male with medical history significant of newly diagnosed metastatic cancer with biopsy pending, anxiety, HTN, tobacco abuse. Pt recently had been undergoing further work up of recently discovered metastatic disease. Family reports markedly decreased PO intake with progressive wt loss. Pt noted to have more falls over the past 2-3 days prior to admit, prompting aide from local [employer]. Pt was also recently treated for oral thrush. Denies chest pain or sob. Pt did note choking on food and liquids on 7/11  ED Course: In the ED, pt noted to have K of 6.2, lokelma ordered. Cr noted to be 0.67 with BUN trended up to 37. Patient was started on IVF hydration and hospitalist consulted for consideration for admission.  Review of Systems:  Review of Systems  Constitutional: Positive for chills, malaise/fatigue and weight loss.  HENT: Negative for congestion, ear pain and sinus pain.   Eyes: Negative for double vision, photophobia and discharge.  Respiratory: Negative for hemoptysis, sputum production and shortness of breath.   Cardiovascular: Negative for palpitations, orthopnea and leg swelling.  Gastrointestinal: Negative for constipation, nausea and vomiting.  Genitourinary: Negative for frequency, hematuria and urgency.  Musculoskeletal: Positive for falls. Negative for neck pain.  Neurological: Negative for tingling, tremors, seizures and loss of consciousness.  Psychiatric/Behavioral: Negative for hallucinations, memory loss and substance abuse.    Past Medical History:  Diagnosis Date  . Allergic rhinitis   . Anal condylomata   . Anxiety   . Cancer ([place]) 05/2020   widespread (lung, liver, abd nodes, pelvis, extending into glut soft tissues-->no tissue dx as of 06/17/20-->referred to  onc 06/17/20.  Presented as low back pain and abnormal weight loss.  Marland Kitchen GERD (gastroesophageal reflux disease)   . Gout   . HTN (hypertension)   . Hyperlipidemia 2017   Improved with TLC 11/2016  . IFG (impaired fasting glucose) 2016   A1c 5.2% 05/2015  . Palpable abdominal aorta 06/28/2011   Aortic u/s 07/03/11 showed NO AAA.  . Tobacco dependence    Trial of chantix 07/2017.  Ongoing, although much less, as of 07/2018.    Past Surgical History:  Procedure Laterality Date  . anal wart excision    . ESOPHAGOGASTRODUODENOSCOPY  approx mid 1990s   pt reports normal.  . SKIN CANCER EXCISION  05/2011   SSC from left side of face/neck     reports that he has been smoking cigarettes. He has been smoking about 0.50 packs per day. He has never used smokeless tobacco. He reports current alcohol use. He reports that he does not use drugs.  Allergies  Allergen Reactions  . Codeine     Family History  Problem Relation Age of Onset  . Hypertension Mother   . Cancer Father 17       lung cancer  . Hypertension Father   . Hypertension Paternal Grandfather     Prior to Admission medications   Medication Sig Start Date End Date Taking? Authorizing Provider  ALPRAZolam Duanne Moron) 0.5 MG tablet TAKE 1-2 TABLETS BY MOUTH TWICE A DAY AS NEEDED 04/28/20   McGowen, Adrian Blackwater, MD  amLODipine-olmesartan (AZOR) 10-40 MG tablet TAKE 1 TABLET BY MOUTH EVERY DAY 04/26/20   McGowen, Adrian Blackwater, MD  baclofen (LIORESAL) 10 MG tablet Take 0.5-1 tablets (5-10 mg total)  by mouth 3 (three) times daily as needed for muscle spasms. Patient not taking: Reported on 06/08/2020 04/21/20   Hilts, Legrand Como, MD  colchicine 0.6 MG tablet 2 tabs po at onset of gout flare, then 1 tab 2 hours later.  Then 1 tab po bid beginning the following day until no further pain Patient not taking: Reported on 06/08/2020 09/09/15   Todd Sou, MD  fluconazole (DIFLUCAN) 100 MG tablet Take 1 tablet (100 mg total) by mouth daily. 06/29/20    Heilingoetter, Cassandra L, PA-C  fluticasone (FLONASE) 50 MCG/ACT nasal spray 2 sprays in each nostril once daily for nasal allergy symptoms 08/15/17   McGowen, Adrian Blackwater, MD  oxyCODONE (OXY IR/ROXICODONE) 5 MG immediate release tablet 1-2 tabs po q6h prn pain 06/18/20   McGowen, Adrian Blackwater, MD  pantoprazole (PROTONIX) 40 MG tablet TAKE 1 TABLET BY MOUTH EVERY DAY 06/29/20   McGowen, Adrian Blackwater, MD  predniSONE (DELTASONE) 10 MG tablet Take as directed for 12 days.  Daily dose 6,6,5,5,4,4,3,3,2,2,1,1. Patient not taking: Reported on 06/08/2020 04/21/20   Eunice Blase, MD    Physical Exam: Vitals:   06/29/2020 1345 06/26/2020 1400 06/23/2020 1401 07/13/2020 1415  BP:  131/68    Pulse: 90 (!) 104 99 (!) 109  Resp: (!) 25 18 (!) 29 17  Temp:      TempSrc:      SpO2: 91% (!) 88% 91% (!) 86%  Weight:      Height:        Constitutional: NAD, calm, comfortable Vitals:   06/23/2020 1345 07/01/2020 1400 06/26/2020 1401 07/05/2020 1415  BP:  131/68    Pulse: 90 (!) 104 99 (!) 109  Resp: (!) 25 18 (!) 29 17  Temp:      TempSrc:      SpO2: 91% (!) 88% 91% (!) 86%  Weight:      Height:       Eyes: PERRL, lids and conjunctivae normal ENMT: Mucous membranes are moist. Posterior pharynx clear of any exudate or lesions.Normal dentition.  Neck: normal, supple, no masses, no thyromegaly Respiratory: clear to auscultation bilaterally, coarse breath sounds Cardiovascular: Regular rate and rhythm, regular, s1, s2  Abdomen: no tenderness, no masses palpated. Musculoskeletal: no clubbing / cyanosis. No joint deformity upper and lower extremities. Good ROM, no contractures. Normal muscle tone.  Skin: no rashes, lesions, Neurologic: CN 2-12 grossly intact. Sensation intact, DTR normal. Strength 5/5 in all 4.  Psychiatric: Normal judgment and insight. Alert and oriented x 3. Normal mood.    Labs on Admission: I have personally reviewed following labs and imaging studies  CBC: Recent Labs  Lab 06/29/20 0939  06/20/2020 0807  WBC 9.3 9.2  NEUTROABS 7.8* 8.2*  HGB 14.3 14.0  HCT 41.5 41.1  MCV 83.2 85.1  PLT 359 151   Basic Metabolic Panel: Recent Labs  Lab 06/29/20 0939 07/12/2020 1304  NA 130* 135  K 5.3* 6.2*  CL 94* 97*  CO2 17* 23  GLUCOSE 73 71  BUN 35* 37*  CREATININE 0.81 0.67  CALCIUM 12.1* 10.6*   GFR: Estimated Creatinine Clearance: 76.8 mL/min (by C-G formula based on SCr of 0.67 mg/dL). Liver Function Tests: Recent Labs  Lab 06/29/20 0939 07/12/2020 1304  AST 113* 132*  ALT 28 33  ALKPHOS 459* 362*  BILITOT 1.5* 2.0*  PROT 6.5 5.6*  ALBUMIN 2.7* 2.6*   No results for input(s): LIPASE, AMYLASE in the last 168 hours. No results for input(s): AMMONIA in the  last 168 hours. Coagulation Profile: Recent Labs  Lab 06/23/2020 0807  INR 1.1   Cardiac Enzymes: No results for input(s): CKTOTAL, CKMB, CKMBINDEX, TROPONINI in the last 168 hours. BNP (last 3 results) No results for input(s): PROBNP in the last 8760 hours. HbA1C: No results for input(s): HGBA1C in the last 72 hours. CBG: No results for input(s): GLUCAP in the last 168 hours. Lipid Profile: No results for input(s): CHOL, HDL, LDLCALC, TRIG, CHOLHDL, LDLDIRECT in the last 72 hours. Thyroid Function Tests: No results for input(s): TSH, T4TOTAL, FREET4, T3FREE, THYROIDAB in the last 72 hours. Anemia Panel: No results for input(s): VITAMINB12, FOLATE, FERRITIN, TIBC, IRON, RETICCTPCT in the last 72 hours. Urine analysis: No results found for: COLORURINE, APPEARANCEUR, LABSPEC, PHURINE, GLUCOSEU, HGBUR, BILIRUBINUR, KETONESUR, PROTEINUR, UROBILINOGEN, NITRITE, LEUKOCYTESUR Sepsis Labs: !!!!!!!!!!!!!!!!!!!!!!!!!!!!!!!!!!!!!!!!!!!! @LABRCNTIP (procalcitonin:4,lacticidven:4) )No results found for this or any previous visit (from the past 240 hour(s)).   Radiological Exams on Admission: DG Chest 1 View  Result Date: 07/17/2020 CLINICAL DATA:  Cough and shortness of breath. Recent diagnosis of metastatic lung  cancer. EXAM: CHEST  1 VIEW COMPARISON:  Chest CT 06/17/2020 FINDINGS: The heart is normal in size. Stable appearing left hilar mass. No acute superimposed pulmonary findings such as pleural effusion or pneumothorax. No pulmonary edema. The bony thorax is grossly intact. IMPRESSION: 1. Stable left hilar mass. 2. No acute pulmonary findings. Electronically Signed   By: Marijo Sanes M.D.   On: 07/12/2020 12:41   CT Head Wo Contrast  Result Date: 06/23/2020 CLINICAL DATA:  Altered mental status.  Metastatic lung cancer. EXAM: CT HEAD WITHOUT CONTRAST TECHNIQUE: Contiguous axial images were obtained from the base of the skull through the vertex without intravenous contrast. COMPARISON:  None. FINDINGS: Brain: The brain itself has a normal appearance without evidence of old or acute infarction, mass lesion, hemorrhage, hydrocephalus or extra-axial collection. In the left frontal subarachnoid space, there is a vascular calcification not felt to be of clinical significance. Vascular: There is atherosclerotic calcification of the major vessels at the base of the brain. Skull: No calvarial lesion is seen. There is a lytic metastasis at the skull base on the right with involvement of the occipital condyle and C1 vertebra. There is a soft tissue mass associated with this measuring over 5 cm in size and with potential to affect the right vertebral artery. Extradural tumor within the right lateral aspect the foramen magnum but without apparent compression of the brainstem. Sinuses/Orbits: Clear/normal Other: None IMPRESSION: Normal appearance of the brain itself. Metastatic disease affecting the skull base and C1 vertebra on the right with a soft tissue mass measuring over 5 cm in size. Potential for compromise of the right vertebral artery because of this. Extradural tumor encroachment upon the right side of the foramen magnum but without brainstem compression Electronically Signed   By: [name] Chimes M.D.   On: 06/21/2020  13:12   CT Biopsy  Result Date: 06/25/2020 INDICATION: Advanced metastatic disease of uncertain primary etiology. Imaging findings are suspicious for small cell lung cancer. EXAM: CT-guided biopsy MEDICATIONS: None. ANESTHESIA/SEDATION: Moderate (conscious) sedation was employed during this procedure. A total of Versed 1 mg and Fentanyl 50 mcg was administered intravenously. Moderate Sedation Time: 10 minutes. The patient's level of consciousness and vital signs were monitored continuously by radiology nursing throughout the procedure under my direct supervision. FLUOROSCOPY TIME:  None COMPLICATIONS: None immediate. PROCEDURE: Informed written consent was obtained from the patient after a thorough discussion of the procedural risks, benefits and alternatives.  All questions were addressed. Maximal Sterile Barrier Technique was utilized including caps, mask, sterile gowns, sterile gloves, sterile drape, hand hygiene and skin antiseptic. A timeout was performed prior to the initiation of the procedure. A planning axial CT scan was performed. The large exophytic soft tissue mass arising from the left iliac bone was successfully identified. The overlying skin was sterilely prepped and draped in the standard fashion using chlorhexidine skin prep. Local anesthesia was attained by infiltration with 1% lidocaine. A small dermatotomy was made. A 17 gauge introducer needle was advanced into the margin of the mass. Multiple 18 gauge core biopsies were then obtained coaxially using the bio Pince automated biopsy device. Biopsy specimens were placed in formalin and delivered to pathology for further analysis. IMPRESSION: Successful CT-guided core biopsy of exophytic left iliac mass. Electronically Signed   By: Jacqulynn Cadet M.D.   On: 07/02/2020 10:41    EKG: Independently reviewed. Sinus  Assessment/Plan Principal Problem:   Dehydration Active Problems:   HTN (hypertension), benign   Hypercalcemia    Metastatic carcinoma (HCC)   Hyperkalemia   1. Dehydration with falls 1. Labs reviewed, BUN trending up, currently 37 2. Clinically dehydrated with report of very limited PO intake prior to admit 3. Will continue IVF hydration 4. Have requested orthostatic vitals 5. Repeat bmet in AM 2. HTN 1. BP currently stable 2. Cont home meds 3. Recent hypercalcemia  1. Recently treated as outpatient with IVF challenge 2. Ca currently 10.6 3. Repeat lytes in AM 4. Hyperkalemia 1. K of 6.2 noted 2. Will give trial of lokelma 3. Repeat bmet in AM 5. Metastatic cancer s/p biopsy 1. Followed by Dr. Julien Nordmann as outpatient 2. Widely metastatic disease noted 3. Pt now s/p biopsy with Oncology recs to continue palliative radiation as scheduled. Further treatment to be determined based on biopsy result, per Oncology recs. Chart reviewed 4. Pt scheduled to start radiation tx as outpatient on 7/20 6. Dysphagia 1. Pt noted to have choked on food and liquids (both water and thicker liquids) on 7/11 2. Pt describes sensation of food getting stuck in throat 3. Will keep NPO for now 4. Consult SLP 7. Oral thrush 1. Continue fluconazole per home regimen 2. Suspect esophageal component resulting in above dysphagia 8. Active tobacco abuse 1. Reports smoking 1ppd 2. Cessation done at bedside 3. Pt declines nicotine patch at this time  DVT prophylaxis: Lovenox subq  Code Status: Full Family Communication: Pt in room, family at bedside  Disposition Plan:   Consults called:  Admission status: Inpatient as would require greater than 2 midnight stay to correct dehydration with IVF and correct hyperkalemia   Marylu Lund MD [facility] Pager On Amion  If 7PM-7AM, please contact night-coverage  07/08/2020, 2:39 PM

## 2020-06-30 NOTE — ED Triage Notes (Signed)
Patient was transferred from short stay to the ED after a bone marrow biopsy. While in short stay he received 1 mg of versed and 50 mcg of fentanyl. While at home the patient did have a fall and has had declining health. Family is requesting placement. There has been some consideration for radiation therapy for liver mets. He is lethargic and oriented to person and place.

## 2020-07-01 ENCOUNTER — Inpatient Hospital Stay (HOSPITAL_COMMUNITY): Payer: BC Managed Care – PPO

## 2020-07-01 ENCOUNTER — Encounter (HOSPITAL_COMMUNITY): Payer: Self-pay | Admitting: Internal Medicine

## 2020-07-01 DIAGNOSIS — L8990 Pressure ulcer of unspecified site, unspecified stage: Secondary | ICD-10-CM | POA: Insufficient documentation

## 2020-07-01 DIAGNOSIS — J9601 Acute respiratory failure with hypoxia: Secondary | ICD-10-CM | POA: Diagnosis present

## 2020-07-01 LAB — CBC
HCT: 39.3 % (ref 39.0–52.0)
HCT: 39.5 % (ref 39.0–52.0)
Hemoglobin: 13.2 g/dL (ref 13.0–17.0)
Hemoglobin: 13.3 g/dL (ref 13.0–17.0)
MCH: 28.4 pg (ref 26.0–34.0)
MCH: 28.5 pg (ref 26.0–34.0)
MCHC: 33.4 g/dL (ref 30.0–36.0)
MCHC: 33.8 g/dL (ref 30.0–36.0)
MCV: 84.2 fL (ref 80.0–100.0)
MCV: 85.1 fL (ref 80.0–100.0)
Platelets: 289 10*3/uL (ref 150–400)
Platelets: 295 10*3/uL (ref 150–400)
RBC: 4.64 MIL/uL (ref 4.22–5.81)
RBC: 4.67 MIL/uL (ref 4.22–5.81)
RDW: 14.2 % (ref 11.5–15.5)
RDW: 14.4 % (ref 11.5–15.5)
WBC: 11.1 10*3/uL — ABNORMAL HIGH (ref 4.0–10.5)
WBC: 11.7 10*3/uL — ABNORMAL HIGH (ref 4.0–10.5)
nRBC: 0 % (ref 0.0–0.2)
nRBC: 0 % (ref 0.0–0.2)

## 2020-07-01 LAB — COMPREHENSIVE METABOLIC PANEL
ALT: 32 U/L (ref 0–44)
AST: 130 U/L — ABNORMAL HIGH (ref 15–41)
Albumin: 2.4 g/dL — ABNORMAL LOW (ref 3.5–5.0)
Alkaline Phosphatase: 333 U/L — ABNORMAL HIGH (ref 38–126)
Anion gap: 14 (ref 5–15)
BUN: 30 mg/dL — ABNORMAL HIGH (ref 8–23)
CO2: 20 mmol/L — ABNORMAL LOW (ref 22–32)
Calcium: 9.4 mg/dL (ref 8.9–10.3)
Chloride: 102 mmol/L (ref 98–111)
Creatinine, Ser: 0.51 mg/dL — ABNORMAL LOW (ref 0.61–1.24)
GFR calc Af Amer: >60 mL/min (ref >60)
GFR calc non Af Amer: >60 mL/min (ref >60)
Glucose, Bld: 66 mg/dL — ABNORMAL LOW (ref 70–99)
Potassium: 4.1 mmol/L (ref 3.5–5.1)
Sodium: 136 mmol/L (ref 135–145)
Total Bilirubin: 1.3 mg/dL — ABNORMAL HIGH (ref 0.3–1.2)
Total Protein: 5.4 g/dL — ABNORMAL LOW (ref 6.5–8.1)

## 2020-07-01 LAB — BLOOD GAS, ARTERIAL
Acid-base deficit: 2.4 mmol/L — ABNORMAL HIGH (ref 0.0–2.0)
Bicarbonate: 21.2 mmol/L (ref 20.0–28.0)
Drawn by: 331471
FIO2: 44
O2 Content: 6 L/min
O2 Saturation: 83.1 %
Patient temperature: 101
pCO2 arterial: 36.9 mmHg (ref 32.0–48.0)
pH, Arterial: 7.385 (ref 7.350–7.450)
pO2, Arterial: 54.9 mmHg — ABNORMAL LOW (ref 83.0–108.0)

## 2020-07-01 LAB — CREATININE, SERUM
Creatinine, Ser: 0.5 mg/dL — ABNORMAL LOW (ref 0.61–1.24)
GFR calc Af Amer: >60 mL/min (ref >60)
GFR calc non Af Amer: >60 mL/min (ref >60)

## 2020-07-01 LAB — GLUCOSE, CAPILLARY
Glucose-Capillary: 63 mg/dL — ABNORMAL LOW (ref 70–99)
Glucose-Capillary: 96 mg/dL (ref 70–99)
Glucose-Capillary: 68 mg/dL — ABNORMAL LOW (ref 70–99)

## 2020-07-01 LAB — BRAIN NATRIURETIC PEPTIDE: B Natriuretic Peptide: 234.3 pg/mL — ABNORMAL HIGH (ref 0.0–100.0)

## 2020-07-01 LAB — MRSA PCR SCREENING: MRSA by PCR: NEGATIVE

## 2020-07-01 LAB — HIV ANTIBODY (ROUTINE TESTING W REFLEX): HIV Screen 4th Generation wRfx: NONREACTIVE

## 2020-07-01 LAB — CBG MONITORING, ED: Glucose-Capillary: 72 mg/dL (ref 70–99)

## 2020-07-01 MED ORDER — DEXTROSE 50 % IV SOLN
INTRAVENOUS | Status: AC
  Administered 2020-07-01 (×2): 25 mL
  Filled 2020-07-01: qty 50

## 2020-07-01 MED ORDER — CHLORHEXIDINE GLUCONATE 0.12 % MT SOLN
15.0000 mL | Freq: Two times a day (BID) | OROMUCOSAL | Status: DC
  Administered 2020-07-01 – 2020-07-03 (×4): 15 mL via OROMUCOSAL
  Filled 2020-07-01 (×3): qty 15

## 2020-07-01 MED ORDER — CHLORHEXIDINE GLUCONATE CLOTH 2 % EX PADS
6.0000 | MEDICATED_PAD | Freq: Every day | CUTANEOUS | Status: DC
  Administered 2020-07-02: 6 via TOPICAL

## 2020-07-01 MED ORDER — ORAL CARE MOUTH RINSE
15.0000 mL | Freq: Two times a day (BID) | OROMUCOSAL | Status: DC
  Administered 2020-07-02 – 2020-07-03 (×2): 15 mL via OROMUCOSAL

## 2020-07-01 MED ORDER — DEXTROSE-NACL 5-0.45 % IV SOLN: INTRAVENOUS | Status: DC

## 2020-07-01 MED ORDER — IOHEXOL 350 MG/ML SOLN
100.0000 mL | Freq: Once | INTRAVENOUS | Status: AC | PRN
  Administered 2020-07-01: 100 mL via INTRAVENOUS

## 2020-07-01 MED ORDER — DEXTROSE 50 % IV SOLN
12.5000 g | Freq: Once | INTRAVENOUS | Status: AC
  Administered 2020-07-01: 12.5 g via INTRAVENOUS
  Filled 2020-07-01: qty 50

## 2020-07-01 MED ORDER — GUAIFENESIN ER 600 MG PO TB12
600.0000 mg | ORAL_TABLET | Freq: Two times a day (BID) | ORAL | Status: DC
  Administered 2020-07-01: 600 mg via ORAL
  Filled 2020-07-01: qty 1

## 2020-07-01 MED ORDER — SODIUM CHLORIDE (PF) 0.9 % IJ SOLN
INTRAMUSCULAR | Status: AC
  Administered 2020-07-01: 1 mL
  Filled 2020-07-01: qty 50

## 2020-07-01 MED ORDER — SODIUM CHLORIDE 0.9 % IV SOLN
3.0000 g | Freq: Four times a day (QID) | INTRAVENOUS | Status: DC
  Administered 2020-07-01 – 2020-07-02 (×5): 3 g via INTRAVENOUS
  Filled 2020-07-01: qty 3
  Filled 2020-07-01 (×3): qty 8
  Filled 2020-07-01: qty 3
  Filled 2020-07-01 (×2): qty 8

## 2020-07-01 NOTE — ED Notes (Signed)
Admitting provider Posey Pronto at bedside.

## 2020-07-01 NOTE — Progress Notes (Signed)
Vest on hold due to pt wanting to sleep/rest. Family at bedside. No complication at this time.

## 2020-07-01 NOTE — Progress Notes (Signed)
OT Cancellation Note  Patient Details Name: Todd Garner MRN: [id number] DOB: 1956/07/14   Cancelled Treatment:    Reason Eval/Treat Not Completed: Other (comment) Patient placed on non rebreather this morning and with elevated temperature, respiration rate. Will hold OT eval at this time and check back as schedule allows.  Delbert Phenix OT Pager: [place] 07/01/2020, 9:33 AM

## 2020-07-01 NOTE — ED Notes (Signed)
Pt placed back on [place] at 6L/min.  O2 96% on [place].  Will continue to monitor.

## 2020-07-01 NOTE — ED Notes (Signed)
Attempted report x1. 

## 2020-07-01 NOTE — Progress Notes (Signed)
SLP Cancellation Note  Patient Details Name: Todd Garner MRN: [id number] DOB: 1956/01/12   Cancelled treatment:       Reason Eval/Treat Not Completed: Medical issues which prohibited therapy - pt currently requiring NRB. Recommend hold on BSE until pt respiratory status improves and O2 requirements have lessened. RN aware.  Vasil Juhasz B. Quentin Ore, [facility], [place] Speech Language Pathologist Office: [phone]  Todd Garner 07/01/2020, 10:35 AM

## 2020-07-01 NOTE — ED Notes (Signed)
MD Joellyn Quails regarding pts ordered PO meds.  Due to concern for aspiration with PO intake, verbal order from MD Posey Pronto ok to hold PO meds until after pt has speech eval.

## 2020-07-01 NOTE — ED Notes (Signed)
Hospitalist made aware about this nurse increasing oxygen levels but oxygen saturation remaining low and patients cough.

## 2020-07-01 NOTE — Progress Notes (Signed)
[facility] Progress Note  Patient: Todd Garner    DQQ:[id number]  DOA: 07/15/2020     Date of Service: the patient was seen and examined on 07/01/2020  Brief hospital course: Patient with past medical history of metastatic cancer with pending biopsy result, anxiety, HTN, tobacco abuse.  Presenting with poor p.o. intake and weight loss as well as fall.  Reportedly had a choking episode on 06/27/2020 as well as in the ER on admission. Become hypoxic and requiring nonrebreather.  Suspecting to have aspiration pneumonia Currently plan is continue IV antibiotics and close monitoring in the stepdown unit.  Assessment and Plan: 1.  Sepsis secondary to aspiration pneumonia, POA Tachypneic, tachycardic, temp of 101.  Requiring nonrebreather and leukocytosis.  Chest x-ray showing evidence of right-sided pneumonia. All suspicious for aspiration. N.p.o. for now. Speech therapy consulted. Continue to improve oxygenation.  Suggest PT. IV Unasyn. Follow-up on blood culture. Discuss goals of care with the patient and wife.  They have some documentation at home but no clarity on what is written in the documentation.  2.  Dehydration's with fall Given IV fluid. Continue to monitor.  BNP slightly elevated.  3.  Essential hypertension Blood pressure stable.  Continue home medication.  4.  Hyperkalemia Corrected.  Monitor.  5.  Hypoglycemia From poor p.o. intake and n.p.o. status due to swallowing concern. For now we will continue D5 half normal saline at 50's per hour.  6.  Dysphagia Speech therapy consulted.  7.  Metastatic lung cancer. Due to present severe hypoxia CT PE protocol was performed which is negative for any acute abnormality. Family was informed that this is a stage IV lung cancer. CT head is also positive for skull base metastasis. Patient will benefit from MRI brain with and without contrast once more medically stable. CT chest abdomen shows no evidence of spinal  metastasis for now. Attempted to reach medical oncology multiple times.  Will attempt again tomorrow.  Diet: N.p.o. pending speech evaluation DVT Prophylaxis:   enoxaparin (LOVENOX) injection 40 mg Start: 06/27/2020 1800    Advance goals of care discussion: Full code  Family Communication: family was present at bedside, at the time of interview.  The pt provided permission to discuss medical plan with the family. Opportunity was given to ask question and all questions were answered satisfactorily.   Disposition:  Status is: Inpatient  Remains inpatient appropriate because:Hemodynamically unstable and Altered mental status  Dispo: The patient is from: Home              Anticipated d/c is to: Home              Anticipated d/c date is: > 3 days              Patient currently is not medically stable to d/c.  Subjective: Fatigue and tiredness.  Also cough.  Also shortness of breath.  No nausea no vomiting.  No fever no chills.  No chest pain.  Abdominal pain.  Physical Exam:  General: Appear in marked distress, no Rash; Oral Mucosa Clear, moist. no Abnormal Neck Mass Or lumps, Conjunctiva normal  Cardiovascular: S1 and S2 Present, no Murmur, Respiratory: increased respiratory effort, Bilateral Air entry present and bilateral  Crackles, no wheezes Abdomen: Bowel Sound present, Soft and no tenderness Extremities: no Pedal edema, no calf tenderness Neurology: alert and oriented to time, place, and person affect appropriate. no new focal deficit Gait not checked due to patient safety concerns  Vitals:   07/01/20  1602 07/01/20 1637 07/01/20 1700 07/01/20 1800  BP: (!) 109/55 116/61 (!) 114/48 (!) 117/56  Pulse: 97 100 95 (!) 103  Resp: 20 (!) 26 (!) 24 20  Temp: 98.6 F (37 C) (!) 97.1 F (36.2 C)    TempSrc: Oral Axillary    SpO2: 100% 100% 100% 98%  Weight:  60.1 kg    Height:  5\' 9"  (1.753 m)      Intake/Output Summary (Last 24 hours) at 07/01/2020 1901 Last data filed at  07/01/2020 1830 Gross per 24 hour  Intake 200 ml  Output 500 ml  Net -300 ml   Filed Weights   07/06/2020 1203 07/01/20 1637  Weight: 56.7 kg 60.1 kg    Data Reviewed: I have personally reviewed and interpreted daily labs, tele strips, imagings as discussed above. I reviewed all nursing notes, pharmacy notes, vitals, pertinent old records I have discussed plan of care as described above with RN and patient/family.  CBC: Recent Labs  Lab 06/29/20 0939 06/28/2020 0807 06/22/2020 2353 07/01/20 0445  WBC 9.3 9.2 11.1* 11.7*  NEUTROABS 7.8* 8.2*  --   --   HGB 14.3 14.0 13.3 13.2  HCT 41.5 41.1 39.3 39.5  MCV 83.2 85.1 84.2 85.1  PLT 359 330 295 235   Basic Metabolic Panel: Recent Labs  Lab 06/29/20 0939 06/29/2020 1304 07/11/2020 2353 07/01/20 0445  NA 130* 135  --  136  K 5.3* 6.2*  --  4.1  CL 94* 97*  --  102  CO2 17* 23  --  20*  GLUCOSE 73 71  --  66*  BUN 35* 37*  --  30*  CREATININE 0.81 0.67 0.50* 0.51*  CALCIUM 12.1* 10.6*  --  9.4    Studies: CT ANGIO CHEST PE W OR WO CONTRAST  Result Date: 07/01/2020 CLINICAL DATA:  Shortness of breath EXAM: CT ANGIOGRAPHY CHEST WITH CONTRAST TECHNIQUE: Multidetector CT imaging of the chest was performed using the standard protocol during bolus administration of intravenous contrast. Multiplanar CT image reconstructions and MIPs were obtained to evaluate the vascular anatomy. CONTRAST:  159mL OMNIPAQUE IOHEXOL 350 MG/ML SOLN COMPARISON:  06/17/2020 FINDINGS: Cardiovascular: No filling defects in the pulmonary arteries to suggest pulmonary emboli. Heart is normal size. Coronary artery and aortic atherosclerosis. No evidence of aortic aneurysm. Mediastinum/Nodes: Bulky mediastinal and left hilar adenopathy again noted, unchanged. Trachea and esophagus are unremarkable. Thyroid unremarkable. Lungs/Pleura: Large central left upper lobe mass is again seen, unchanged. Consolidation noted in the right lower lobe, new since prior study most  compatible with pneumonia. Small right pleural effusion. Upper Abdomen: Multiple masses within the liver, better seen on prior study. Musculoskeletal: No acute bony abnormality. Review of the MIP images confirms the above findings. IMPRESSION: Large left upper lobe/hilar mass with bulky left hilar and mediastinal adenopathy most compatible with metastatic lung cancer. This is unchanged since recent studies. Consolidation in the posterior right upper lobe compatible with pneumonia. Small right pleural effusion. Coronary artery disease. Aortic Atherosclerosis (ICD10-I70.0). Electronically Signed   By: Rolm Baptise M.D.   On: 07/01/2020 12:01   DG CHEST PORT 1 VIEW  Result Date: 07/01/2020 CLINICAL DATA:  Cough, shortness of breath and low oxygen saturation. EXAM: PORTABLE CHEST 1 VIEW COMPARISON:  06/22/2020 FINDINGS: Heart size is normal in stable. Stable left hilar mass. New right upper lobe airspace process worrisome for pneumonia. No pleural effusions or pneumothorax. IMPRESSION: 1. New right upper lobe infiltrate. 2. Stable left hilar mass. Electronically Signed  By: Marijo Sanes M.D.   On: 07/01/2020 07:00    Scheduled Meds: . chlorhexidine  15 mL Mouth Rinse BID  . [START ON 07/02/2020] Chlorhexidine Gluconate Cloth  6 each Topical Q0600  . enoxaparin (LOVENOX) injection  40 mg Subcutaneous Q24H  . fluconazole  100 mg Oral Daily  . guaiFENesin  600 mg Oral BID  . [START ON 07/02/2020] mouth rinse  15 mL Mouth Rinse q12n4p  . pantoprazole  40 mg Oral Daily   Continuous Infusions: . ampicillin-sulbactam (UNASYN) IV Stopped (07/01/20 1830)  . dextrose 5 % and 0.45% NaCl     PRN Meds: acetaminophen **OR** acetaminophen, morphine injection, oxyCODONE  Time spent: 35 minutes  Author: Berle Mull, MD [facility] 07/01/2020 7:01 PM  To reach On-call, see care teams to locate the attending and reach out via [email or web address]. Between 7PM-7AM, please contact night-coverage If you still  have difficulty reaching the attending provider, please page the [facility] (Director on Call) for [facility] on amion for assistance.

## 2020-07-01 NOTE — ED Notes (Signed)
Pt transferred to hospital bed.  Once repositioned in hospital bed, pt O2 on 5L [place] noted to be 83%.  O2 increased to 6L, no change in O2 saturations, good waveform noted. Admitting provider Posey Pronto paged.  MD Posey Pronto made aware of pts elevated temperature, increased O2 demands.  Verbal order given to place pt on NRB after RT had drawn ABG.  Pt O2 sats increased to 95% on NRB. Will continue to monitor.

## 2020-07-01 NOTE — ED Notes (Signed)
Pt O2 sats decreased to 85% on 6L O2 via [place].  Pt replaced on NRB. O2 sats increased to 93%.  Will continue to monitor.

## 2020-07-01 NOTE — Progress Notes (Signed)
PT Cancellation Note  Patient Details Name: Todd Garner MRN: [id number] DOB: Jan 11, 1956   Cancelled Treatment:     Note that patient is to be admitted. Will check back another time.   [name] 07/01/2020, [place] Pager [phone] Office [phone]

## 2020-07-01 NOTE — Progress Notes (Signed)
Pharmacy Antibiotic Note  Todd Garner is a 64 y.o. male recently diagnosed with metastatic lung cancer (s/p left iliac mass on 7/14) presented to the ED on 07/02/2020 for evaluation of weakness and fall. Chest x-ray on 7/15 showed "new right upper lobe infiltrate".  Pharmacy is consulted to start unasyn for aspiration PNA.  Plan: - unasyn 3gm IV q6h  _________________________________________  Height: 5\' 11"  (180.3 cm) Weight: 56.7 kg (125 lb) IBW/kg (Calculated) : 75.3  Temp (24hrs), Avg:98.5 F (36.9 C), Min:98.2 F (36.8 C), Max:98.7 F (37.1 C)  Recent Labs  Lab 06/29/20 0939 07/14/2020 0807 07/05/2020 1304 06/29/2020 2353 07/01/20 0445  WBC 9.3 9.2  --  11.1* 11.7*  CREATININE 0.81  --  0.67 0.50* 0.51*    Estimated Creatinine Clearance: 76.8 mL/min (A) (by C-G formula based on SCr of 0.51 mg/dL (L)).    Allergies  Allergen Reactions  . Codeine      Thank you for allowing pharmacy to be a part of this patient's care.  Lynelle Doctor 07/01/2020 7:56 AM

## 2020-07-02 ENCOUNTER — Encounter: Payer: Self-pay | Admitting: General Practice

## 2020-07-02 DIAGNOSIS — E43 Unspecified severe protein-calorie malnutrition: Secondary | ICD-10-CM | POA: Insufficient documentation

## 2020-07-02 LAB — CBC WITH DIFFERENTIAL/PLATELET
Abs Immature Granulocytes: 0.06 10*3/uL (ref 0.00–0.07)
Basophils Absolute: 0 10*3/uL (ref 0.0–0.1)
Basophils Relative: 0 %
Eosinophils Absolute: 0 10*3/uL (ref 0.0–0.5)
Eosinophils Relative: 0 %
HCT: 36.9 % — ABNORMAL LOW (ref 39.0–52.0)
Hemoglobin: 12.2 g/dL — ABNORMAL LOW (ref 13.0–17.0)
Immature Granulocytes: 1 %
Lymphocytes Relative: 4 %
Lymphs Abs: 0.4 10*3/uL — ABNORMAL LOW (ref 0.7–4.0)
MCH: 28 pg (ref 26.0–34.0)
MCHC: 33.1 g/dL (ref 30.0–36.0)
MCV: 84.6 fL (ref 80.0–100.0)
Monocytes Absolute: 0.8 10*3/uL (ref 0.1–1.0)
Monocytes Relative: 8 %
Neutro Abs: 8.9 10*3/uL — ABNORMAL HIGH (ref 1.7–7.7)
Neutrophils Relative %: 87 %
Platelets: 280 10*3/uL (ref 150–400)
RBC: 4.36 MIL/uL (ref 4.22–5.81)
RDW: 14.2 % (ref 11.5–15.5)
WBC: 10.2 10*3/uL (ref 4.0–10.5)
nRBC: 0 % (ref 0.0–0.2)

## 2020-07-02 LAB — COMPREHENSIVE METABOLIC PANEL
ALT: 31 U/L (ref 0–44)
AST: 116 U/L — ABNORMAL HIGH (ref 15–41)
Albumin: 2.2 g/dL — ABNORMAL LOW (ref 3.5–5.0)
Alkaline Phosphatase: 271 U/L — ABNORMAL HIGH (ref 38–126)
Anion gap: 11 (ref 5–15)
BUN: 29 mg/dL — ABNORMAL HIGH (ref 8–23)
CO2: 21 mmol/L — ABNORMAL LOW (ref 22–32)
Calcium: 8.7 mg/dL — ABNORMAL LOW (ref 8.9–10.3)
Chloride: 108 mmol/L (ref 98–111)
Creatinine, Ser: 0.58 mg/dL — ABNORMAL LOW (ref 0.61–1.24)
GFR calc Af Amer: >60 mL/min (ref >60)
GFR calc non Af Amer: >60 mL/min (ref >60)
Glucose, Bld: 104 mg/dL — ABNORMAL HIGH (ref 70–99)
Potassium: 4 mmol/L (ref 3.5–5.1)
Sodium: 140 mmol/L (ref 135–145)
Total Bilirubin: 1.7 mg/dL — ABNORMAL HIGH (ref 0.3–1.2)
Total Protein: 5.2 g/dL — ABNORMAL LOW (ref 6.5–8.1)

## 2020-07-02 LAB — GLUCOSE, CAPILLARY
Glucose-Capillary: 65 mg/dL — ABNORMAL LOW (ref 70–99)
Glucose-Capillary: 68 mg/dL — ABNORMAL LOW (ref 70–99)
Glucose-Capillary: 73 mg/dL (ref 70–99)
Glucose-Capillary: 96 mg/dL (ref 70–99)
Glucose-Capillary: 99 mg/dL (ref 70–99)
Glucose-Capillary: 99 mg/dL (ref 70–99)

## 2020-07-02 LAB — SURGICAL PATHOLOGY

## 2020-07-02 LAB — LACTIC ACID, PLASMA: Lactic Acid, Venous: 3.6 mmol/L (ref 0.5–1.9)

## 2020-07-02 LAB — MAGNESIUM: Magnesium: 1.5 mg/dL — ABNORMAL LOW (ref 1.7–2.4)

## 2020-07-02 IMAGING — CT CT ABD-PELV W/ CM
3 series · 15 of 32 positions shown, 18 images · IV contrast (APPLIED)
Comparison: None.

CLINICAL DATA: Abnormal weight loss, smoker, hepatomegaly on exam.
Palpable soft tissue mass and left lower back gluteal region.

EXAM:
CT CHEST, ABDOMEN, AND PELVIS WITH CONTRAST
TECHNIQUE: Multidetector CT imaging of the chest, abdomen and pelvis was
performed following the standard protocol during bolus
administration of intravenous contrast.
CONTRAST:  100mL 3H305B-MFF IOPAMIDOL (3H305B-MFF) INJECTION 61%

[Series 2: chest/abd/pelvis w/cm · axial · 0.74mm/px · z∈[-617,-87]mm · 8 of 130 slices shown]
[im 12/130  soft-tissue]
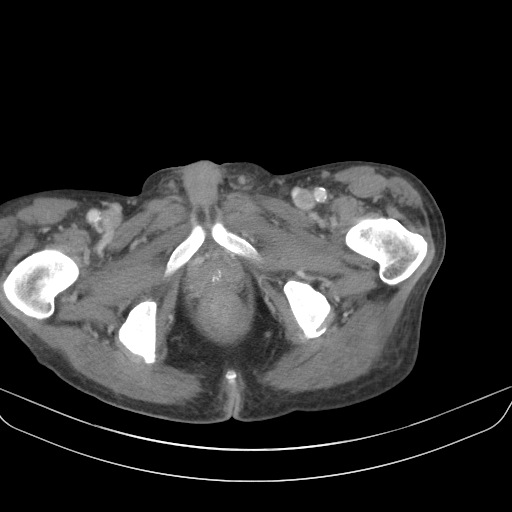
[im 24/130  soft-tissue]
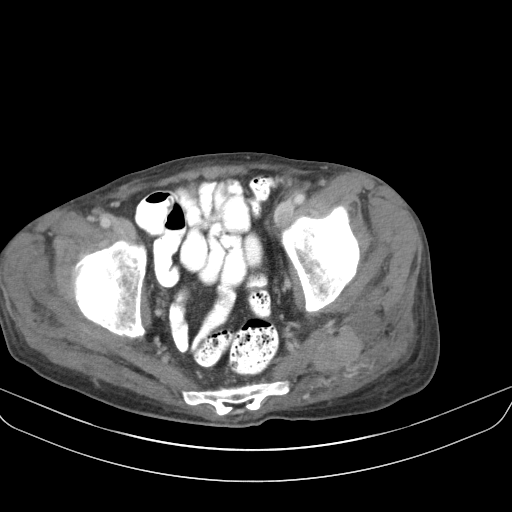
[im 47/130  soft-tissue]
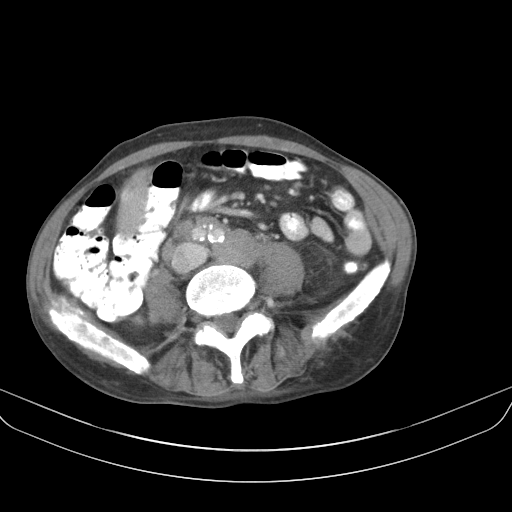
[im 59/130  soft-tissue]
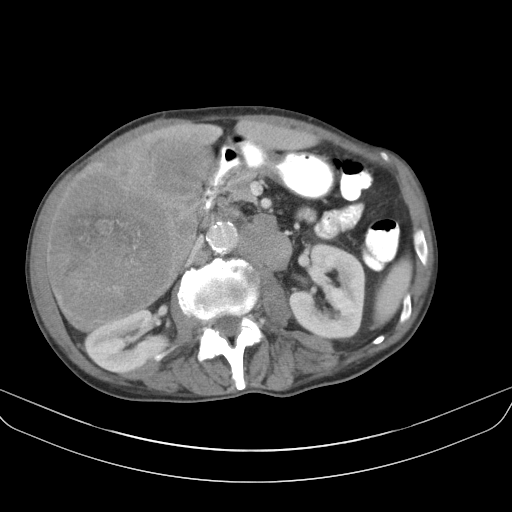
[im 71/130  soft-tissue]
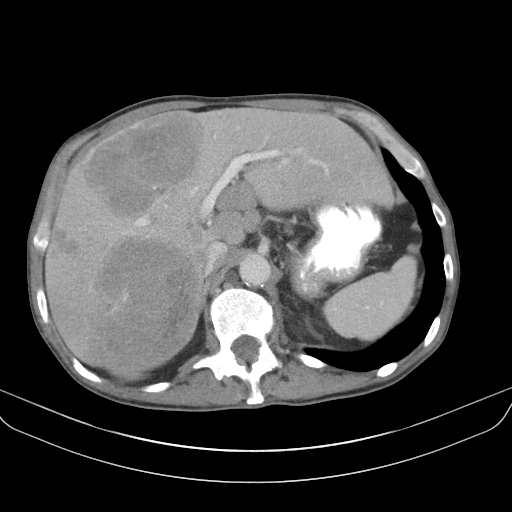
[im 83/130  soft-tissue]
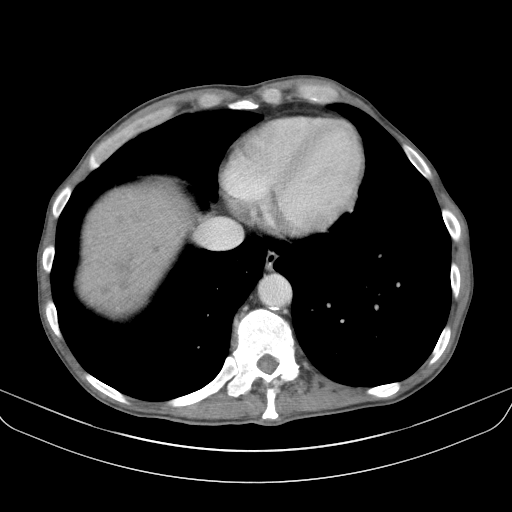
[im 106/130  soft-tissue]
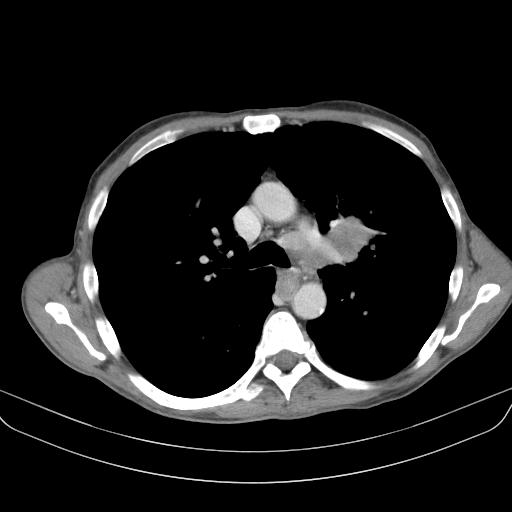
[im 118/130  soft-tissue]
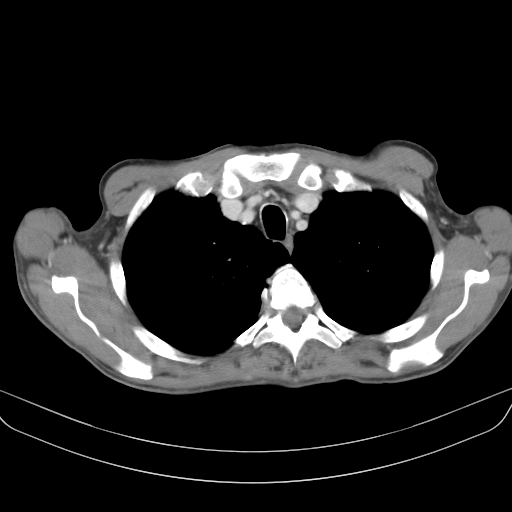

[Series 4: lung · axial · 0.74mm/px · z∈[-335,-181]mm · 5 of 165 slices shown]
[im 11/165  bone]
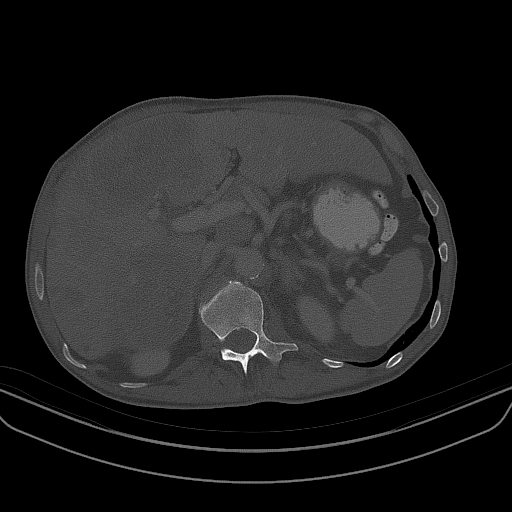
[im 33/165  bone]
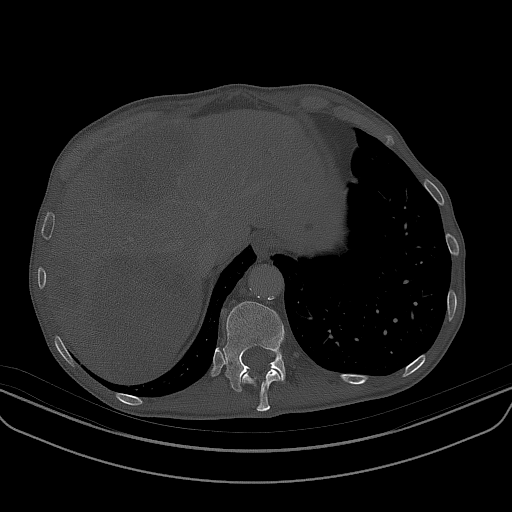
[im 55/165  bone]
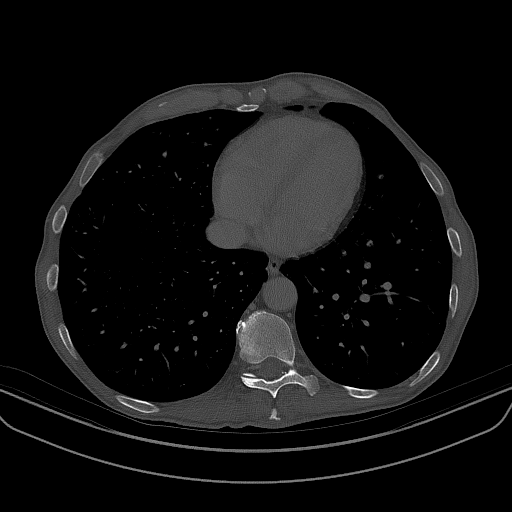
[im 77/165  bone]
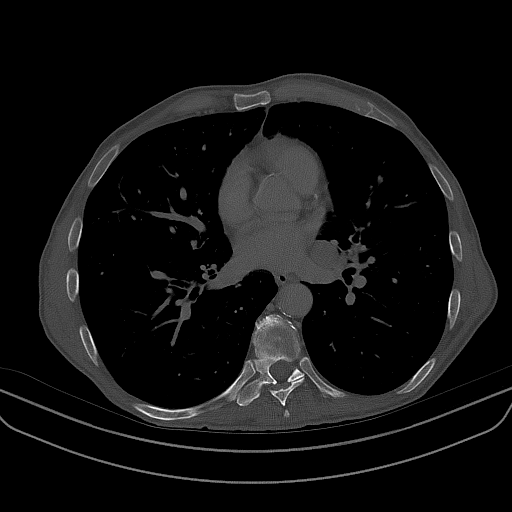
[im 88/165  bone]
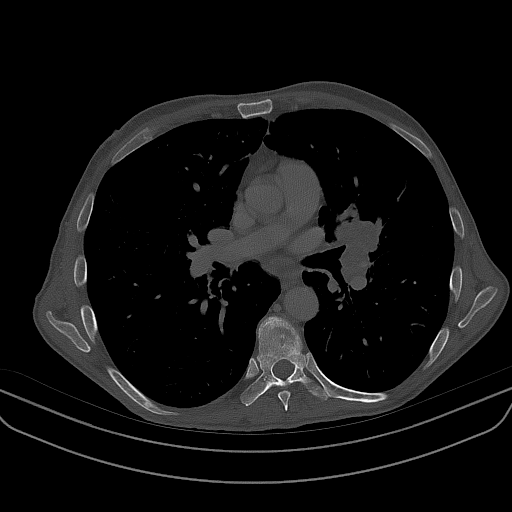

[Series 7: renal delay · axial · delayed · 0.74mm/px · z∈[-391,-331]mm · 2 of 38 slices shown, 5 images]
[im 13/38  soft-tissue]
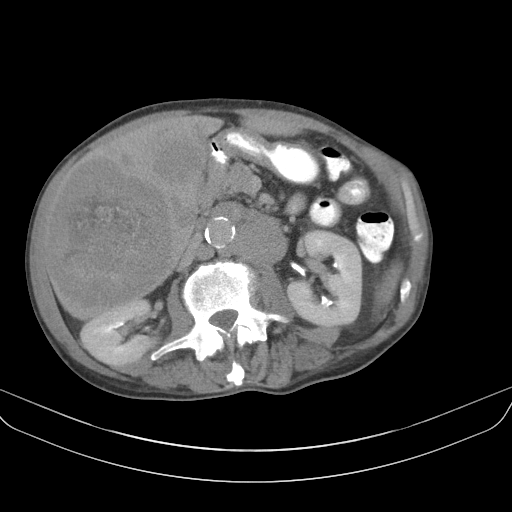
[im 13/38  lung]
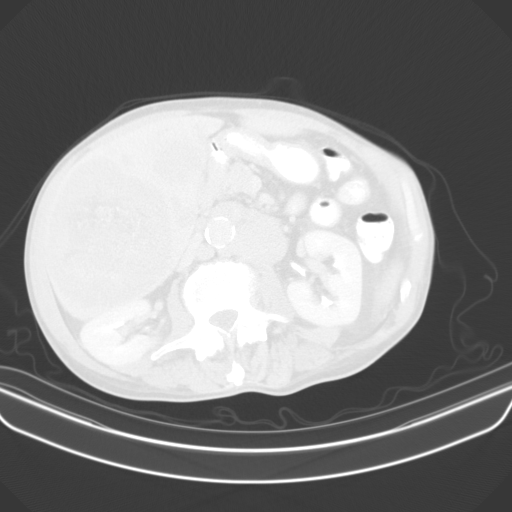
[im 13/38  bone]
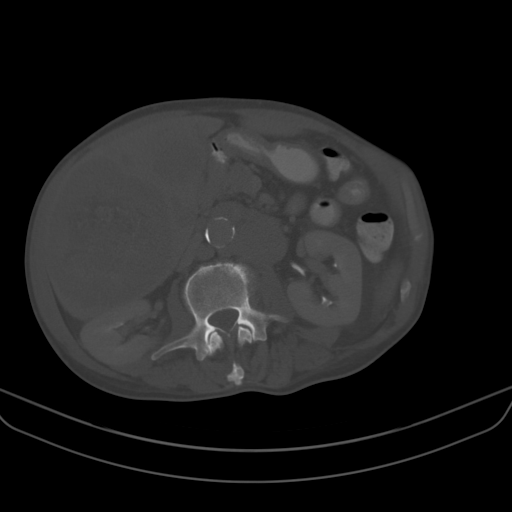
[im 25/38  soft-tissue]
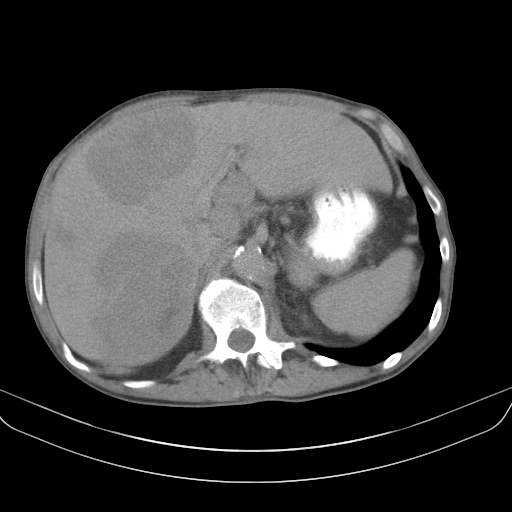
[im 25/38  lung]
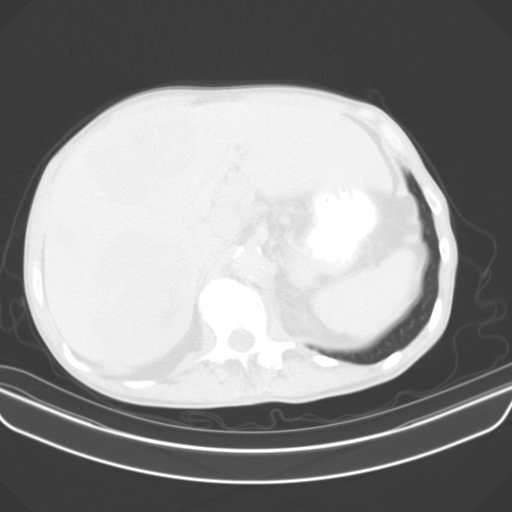

[15 of 32 positions shown; findings below may reference images not displayed]

FINDINGS: CT CHEST FINDINGS

Cardiovascular: Heart is normal size. Aorta is normal caliber.
Coronary artery and aortic calcifications. No aneurysm.

Mediastinum/Nodes: Large conglomerate mass in the left hilum and
left upper lobe measuring 6.8 x 4.5 cm. AP window conglomerate
adenopathy measures up to 2 cm in short axis diameter. Subcarinal
adenopathy. No right hilar or axillary adenopathy.

Lungs/Pleura: Centrilobular emphysema. Conglomerate central left
upper lobe and left hilar nodal mass again noted as measured above.
Remainder the lungs are clear. No effusions.

Musculoskeletal: Chest wall soft tissues are unremarkable.

CT ABDOMEN PELVIS FINDINGS

Hepatobiliary: Large masses throughout the liver compatible with
metastases. The largest measures 11 cm in the right hepatic lobe.
Gallbladder unremarkable.

Pancreas: No focal abnormality or ductal dilatation.

Spleen: No focal abnormality.  Normal size.

Adrenals/Urinary Tract: No adrenal abnormality. No focal renal
abnormality. No stones or hydronephrosis. Urinary bladder is
unremarkable.

Stomach/Bowel: Normal appendix. Stomach, large and small bowel
grossly unremarkable.

Vascular/Lymphatic: Diffuse aortic atherosclerosis. No aneurysm.
Bulky retroperitoneal adenopathy. Left periaortic lymph node has a
short axis diameter of 2.6 cm. Bulky porta hepatis adenopathy with
short axis diameter 3.1 cm. Bilateral iliac nodal adenopathy, left
greater than right with index left iliac lymph node having a short
axis diameter of 2.4 cm.

Reproductive: Mildly prominent prostate with central calcifications.

Other: No free fluid or free air.

Musculoskeletal: Large left gluteal mass measuring up to 8.2 cm.
This likely arises from the left iliac bone with bone destruction
and extension into the gluteal soft tissues. This also extends
across the left SI joint and involves the left sacrum. Large
metastasis in the right iliac bone with soft tissue extension,
measuring up to 4.3 cm.
IMPRESSION: Large left hilar and central left upper lobe conglomerate mass most
compatible with primary lung cancer. Associated mediastinal
adenopathy.

Numerous large metastases throughout the liver measuring up to 11
cm.

Bulky porta hepatis and retroperitoneal/pelvic adenopathy.

Large bone metastases in the iliac bones bilaterally. These are
associated with bone destruction and extension into the adjacent
soft tissues.

Aortic atherosclerosis, coronary artery disease.

Prostate enlargement with central calcifications.

These results will be called to the ordering clinician or
representative by the Radiologist Assistant, and communication
documented in the PACS or [REDACTED].

## 2020-07-02 MED ORDER — HALOPERIDOL LACTATE 2 MG/ML PO CONC
2.0000 mg | ORAL | Status: DC | PRN
  Filled 2020-07-02: qty 1

## 2020-07-02 MED ORDER — ONDANSETRON HCL 4 MG/2ML IJ SOLN: 4.0000 mg | Freq: Four times a day (QID) | INTRAMUSCULAR | Status: DC | PRN

## 2020-07-02 MED ORDER — LORAZEPAM 2 MG/ML IJ SOLN
1.0000 mg | INTRAMUSCULAR | Status: DC | PRN
  Filled 2020-07-02: qty 1

## 2020-07-02 MED ORDER — GLYCOPYRROLATE 0.2 MG/ML IJ SOLN
0.2000 mg | INTRAMUSCULAR | Status: DC | PRN
  Administered 2020-07-03: 0.2 mg via INTRAVENOUS

## 2020-07-02 MED ORDER — LORAZEPAM 2 MG/ML PO CONC
1.0000 mg | ORAL | Status: DC | PRN
  Administered 2020-07-03: 1 mg via SUBLINGUAL
  Filled 2020-07-02 (×3): qty 0.5

## 2020-07-02 MED ORDER — MORPHINE SULFATE (PF) 2 MG/ML IV SOLN
2.0000 mg | INTRAVENOUS | Status: DC | PRN
  Administered 2020-07-02 (×2): 2 mg via INTRAVENOUS
  Administered 2020-07-03: 4 mg via INTRAVENOUS
  Filled 2020-07-02 (×2): qty 1
  Filled 2020-07-02: qty 2

## 2020-07-02 MED ORDER — LIP MEDEX EX OINT
1.0000 "application " | TOPICAL_OINTMENT | CUTANEOUS | Status: DC | PRN
  Administered 2020-07-02: 1 via TOPICAL
  Filled 2020-07-02: qty 7

## 2020-07-02 MED ORDER — SCOPOLAMINE 1 MG/3DAYS TD PT72
1.0000 | MEDICATED_PATCH | TRANSDERMAL | Status: DC
  Administered 2020-07-02: 1.5 mg via TRANSDERMAL
  Filled 2020-07-02: qty 1

## 2020-07-02 MED ORDER — ONDANSETRON 4 MG PO TBDP: 4.0000 mg | ORAL_TABLET | Freq: Four times a day (QID) | ORAL | Status: DC | PRN

## 2020-07-02 MED ORDER — LORAZEPAM 1 MG PO TABS: 1.0000 mg | ORAL_TABLET | ORAL | Status: DC | PRN

## 2020-07-02 MED ORDER — SODIUM CHLORIDE 0.9 % IV BOLUS
1000.0000 mL | Freq: Once | INTRAVENOUS | Status: AC
  Administered 2020-07-02: 1000 mL via INTRAVENOUS

## 2020-07-02 MED ORDER — GLYCOPYRROLATE 0.2 MG/ML IJ SOLN
0.2000 mg | INTRAMUSCULAR | Status: DC | PRN
  Filled 2020-07-02: qty 1

## 2020-07-02 MED ORDER — ACETAMINOPHEN 325 MG PO TABS: 650.0000 mg | ORAL_TABLET | Freq: Four times a day (QID) | ORAL | Status: DC | PRN

## 2020-07-02 MED ORDER — LORAZEPAM 2 MG/ML IJ SOLN
1.0000 mg | INTRAMUSCULAR | Status: DC | PRN
  Administered 2020-07-02: 1 mg via INTRAVENOUS
  Filled 2020-07-02: qty 1

## 2020-07-02 MED ORDER — SODIUM CHLORIDE 0.9% FLUSH: 3.0000 mL | INTRAVENOUS | Status: DC | PRN

## 2020-07-02 MED ORDER — SODIUM CHLORIDE 0.9 % IV SOLN: 250.0000 mL | INTRAVENOUS | Status: DC | PRN

## 2020-07-02 MED ORDER — ACETAMINOPHEN 650 MG RE SUPP: 650.0000 mg | Freq: Four times a day (QID) | RECTAL | Status: DC | PRN

## 2020-07-02 MED ORDER — MAGNESIUM SULFATE 2 GM/50ML IV SOLN
2.0000 g | Freq: Once | INTRAVENOUS | Status: AC
  Administered 2020-07-02: 2 g via INTRAVENOUS
  Filled 2020-07-02: qty 50

## 2020-07-02 MED ORDER — GLYCOPYRROLATE 1 MG PO TABS: 1.0000 mg | ORAL_TABLET | ORAL | Status: DC | PRN

## 2020-07-02 MED ORDER — SODIUM CHLORIDE 0.9% FLUSH
3.0000 mL | Freq: Two times a day (BID) | INTRAVENOUS | Status: DC
  Administered 2020-07-02 – 2020-07-03 (×2): 3 mL via INTRAVENOUS

## 2020-07-02 MED ORDER — MORPHINE SULFATE (PF) 2 MG/ML IV SOLN
2.0000 mg | Freq: Three times a day (TID) | INTRAVENOUS | Status: DC
  Administered 2020-07-02: 2 mg via INTRAVENOUS
  Filled 2020-07-02: qty 1

## 2020-07-02 MED ORDER — ALBUTEROL SULFATE (2.5 MG/3ML) 0.083% IN NEBU: 2.5000 mg | INHALATION_SOLUTION | RESPIRATORY_TRACT | Status: DC | PRN

## 2020-07-02 MED ORDER — HALOPERIDOL LACTATE 5 MG/ML IJ SOLN: 2.0000 mg | INTRAMUSCULAR | Status: DC | PRN

## 2020-07-02 MED ORDER — MAGIC MOUTHWASH
15.0000 mL | Freq: Four times a day (QID) | ORAL | Status: DC | PRN
  Filled 2020-07-02: qty 15

## 2020-07-02 MED ORDER — HALOPERIDOL 0.5 MG PO TABS: 2.0000 mg | ORAL_TABLET | ORAL | Status: DC | PRN

## 2020-07-02 NOTE — Progress Notes (Signed)
[place] CSW Progress Notes  Social Work consult noted, chart reviewed.  Patient currently inpatient, will address consult needs when patient returns to outpatient setting.  CSW [place] and CSW Edwyna Shell will be assigned [place] CSWs.   Edwyna Shell, LCSW Clinical Social Worker Phone:  [phone]

## 2020-07-02 NOTE — Progress Notes (Signed)
SLP Cancellation Note  Patient Details Name: GURVEER COLUCCI MRN: [id number] DOB: [birth year]   Cancelled treatment:       Reason Eval/Treat Not Completed: Other (comment) (pt now comfort care, will sign off)  Kathleen Lime, MS [facility] SLP Acute Rehab Services Office [phone]  Macario Golds 07/02/2020, 2:41 PM

## 2020-07-02 NOTE — Plan of Care (Signed)

## 2020-07-02 NOTE — Progress Notes (Signed)
[facility] Progress Note  Patient: Todd Garner    OZH:[id number]  DOA: 07/11/2020     Date of Service: the patient was seen and examined on 07/02/2020  Brief hospital course: Patient with past medical history of metastatic cancer with pending biopsy result, anxiety, HTN, tobacco abuse.  Presenting with poor p.o. intake and weight loss as well as fall.  Reportedly had a choking episode on 06/27/2020 as well as in the ER on admission. Become hypoxic and requiring nonrebreather.  Suspecting to have aspiration pneumonia Currently plan is continue comfort care and arrange for residential hospice.  Assessment and Plan: 1.  Sepsis secondary to aspiration pneumonia, POA Tachypneic, tachycardic, temp of 101.  Requiring nonrebreather and leukocytosis.  Chest x-ray showing evidence of right-sided pneumonia. All suspicious for aspiration. N.p.o. for now. Speech therapy consulted.  Patient at risk for swallowing any consistency of the food.  Currently no oral medications. Oxygenation improved from nonrebreather to 6 L and currently stable on 6 L of oxygen. Significant tachypnea still exist. Treated with IV Unasyn. No growth in blood culture.  2.  Dehydration's with fall Given IV fluid. Continue to monitor.  BNP slightly elevated.  3.  Essential hypertension Blood pressure stable.  Continue home medication.  4.  Hyperkalemia Corrected.  Monitor.  5.  Hypoglycemia From poor p.o. intake and n.p.o. status due to swallowing concern. Patient was on D5 half-normal saline. Currently due to concern with volume overload as well as goal for comfort discontinue fluid.  6.  Dysphagia Speech therapy consulted. Patient at risk for any consistency of the diet. Likely from fatigue as well as cancer.  7.  Metastatic lung cancer. Due to present severe hypoxia CT PE protocol was performed which is negative for any acute abnormality. Family was informed that this is a stage IV lung cancer. CT head  is also positive for skull base metastasis. CT chest abdomen shows no evidence of spinal metastasis for now. Discussed with medical oncology. Biopsy currently pending. No further work-up recommended for now.  8.  Goals of care conversation. Multiple conversations on 07/02/2020 for goals of care. Discussed with wife at bedside in presence of patient. Followed by conversation with patient and wife separately followed by conversation between wife patient and daughter at bedside. Wife brought in patient's living will which mentions that that if I have a terminal illness and is unable to make any decision for myself I would prefer to be comfort care. Patient also categorically declined artificial nutrition or feeding tube in the living will. Patient has stage IV cancer with significant aspiration risk, severe protein calorie malnutrition and cachexia with poor reserve. Any option for treatment would be palliative intent only and with patient's current performance status not sure whether patient will be candidate for chemotherapy. Patient has uncontrolled pain and significant symptom burden from his pneumonia and sepsis. Initial reason for presentation was confusion which requires CT of the head which showed skull base metastasis with vertebral artery compression as well as foramen magnum compression.  I also suspect possibility of for brain metastasis.  Patient currently delirious and occasionally confused. Discussed with patient he would like to be transition to comfort care and made DNR/DNI. Initially preferred to go home but after discussion with concern for significant symptom burden as well as difficulty managing symptoms at home with his dysphagia and aspiration risk suggested patient will benefit from going to residential hospice. Patient's prognosis is significantly guarded and less than 2 weeks given his poor p.o. intake,  pneumonia with sepsis and aggressive cancer which is stage IV.  Diet:  N.p.o. comfort feed. DVT Prophylaxis: Comfort care   Advance goals of care discussion: DNR/DNI comfort care  Family Communication: family was present at bedside, at the time of interview.  The pt provided permission to discuss medical plan with the family. Opportunity was given to ask question and all questions were answered satisfactorily.   Disposition:  Status is: Inpatient  Remains inpatient appropriate because:Hemodynamically unstable and Altered mental status  Dispo: The patient is from: Home              Anticipated d/c is to: Residential hospice              Anticipated d/c date is: 1 to 2 days              Patient currently is not medically stable to d/c.  Subjective: Remains fatigue and tired.  Occasionally confused.  No nausea no vomiting.  Continues to have significant shortness of breath.  Also has mild cough.  Physical Exam:  General: Appear in severe distress, no Rash; Oral Mucosa Clear, dry. no Abnormal Neck Mass Or lumps, Conjunctiva normal  Cardiovascular: S1 and S2 Present, no Murmur, Respiratory: increased respiratory effort, Bilateral Air entry present and bilateral Crackles, no wheezes Abdomen: Bowel Sound present, Soft and no tenderness Extremities: no Pedal edema, no calf tenderness Neurology: lethargic and oriented to time, place, and person affect tearful. no new focal deficit Gait not checked due to patient safety concerns   Vitals:   07/02/20 1000 07/02/20 1100 07/02/20 1200 07/02/20 1400  BP: (!) 116/59 (!) 112/52 (!) 114/55 (!) 107/59  Pulse: (!) 107  95 (!) 105  Resp: (!) 28 (!) 31 (!) 24 20  Temp:    97.7 F (36.5 C)  TempSrc:    Oral  SpO2: (!) 87%  91% (!) 74%  Weight:      Height:        Intake/Output Summary (Last 24 hours) at 07/02/2020 1907 Last data filed at 07/02/2020 0646 Gross per 24 hour  Intake 1704.61 ml  Output 350 ml  Net 1354.61 ml   Filed Weights   07/06/2020 1203 07/01/20 1637  Weight: 56.7 kg 60.1 kg    Data  Reviewed: I have personally reviewed and interpreted daily labs, tele strips, imagings as discussed above. I reviewed all nursing notes, pharmacy notes, vitals, pertinent old records I have discussed plan of care as described above with RN and patient/family.  CBC: Recent Labs  Lab 06/29/20 0939 07/04/2020 0807 06/24/2020 2353 07/01/20 0445 07/02/20 0224  WBC 9.3 9.2 11.1* 11.7* 10.2  NEUTROABS 7.8* 8.2*  --   --  8.9*  HGB 14.3 14.0 13.3 13.2 12.2*  HCT 41.5 41.1 39.3 39.5 36.9*  MCV 83.2 85.1 84.2 85.1 84.6  PLT 359 330 295 289 673   Basic Metabolic Panel: Recent Labs  Lab 06/29/20 0939 06/20/2020 1304 06/17/2020 2353 07/01/20 0445 07/02/20 0224  NA 130* 135  --  136 140  K 5.3* 6.2*  --  4.1 4.0  CL 94* 97*  --  102 108  CO2 17* 23  --  20* 21*  GLUCOSE 73 71  --  66* 104*  BUN 35* 37*  --  30* 29*  CREATININE 0.81 0.67 0.50* 0.51* 0.58*  CALCIUM 12.1* 10.6*  --  9.4 8.7*  MG  --   --   --   --  1.5*    Studies: No  results found.  Scheduled Meds:  chlorhexidine  15 mL Mouth Rinse BID   Chlorhexidine Gluconate Cloth  6 each Topical Q0600   mouth rinse  15 mL Mouth Rinse q12n4p    morphine injection  2 mg Intravenous TID   pantoprazole  40 mg Oral Daily   scopolamine  1 patch Transdermal Q72H   sodium chloride flush  3 mL Intravenous Q12H   Continuous Infusions:  sodium chloride     PRN Meds: sodium chloride, acetaminophen **OR** acetaminophen, albuterol, glycopyrrolate **OR** glycopyrrolate **OR** glycopyrrolate, haloperidol **OR** haloperidol **OR** haloperidol lactate, lip balm, LORazepam **OR** LORazepam **OR** LORazepam, LORazepam, magic mouthwash, morphine injection, ondansetron **OR** ondansetron (ZOFRAN) IV, sodium chloride flush  Time spent: 35 minutes for care coordination. On top of this 45 minutes spent discussion goals of care.  Author: Berle Mull, MD [facility] 07/02/2020 7:07 PM  To reach On-call, see care teams to locate the  attending and reach out via [email or web address]. Between 7PM-7AM, please contact night-coverage If you still have difficulty reaching the attending provider, please page the [facility] (Director on Call) for [facility] on amion for assistance.

## 2020-07-02 NOTE — TOC Initial Note (Signed)
Transition of Care [facility]) - Initial/Assessment Note    Patient Details  Name: Todd Garner MRN: [id number] Date of Birth: March 31, 1956  [facility] of [facility] CM/SW Contact:    Lynnell Catalan, RN Phone Number: 07/02/2020, 4:23 PM  Clinical Narrative:                 [facility] consult for residential hospice. This CM met with pt and wife at bedside. This CM spoke at length with wife to explain difference in home with hospice and residential hospice. [facility]. Voice Mail referral left with [facility] liaison. TOC will continue to follow.   Activities of Daily Living Home Assistive Devices/Equipment: None ADL Screening (condition at time of admission) Patient's cognitive ability adequate to safely complete daily activities?: No Is the patient deaf or have difficulty hearing?: No Does the patient have difficulty seeing, even when wearing glasses/contacts?: Yes Does the patient have difficulty concentrating, remembering, or making decisions?: Yes Patient able to express need for assistance with ADLs?: Yes Does the patient have difficulty dressing or bathing?: Yes Independently performs ADLs?: No Communication: Independent Dressing (OT): Needs assistance Is this a change from baseline?: Change from baseline, expected to last >3 days Grooming: Needs assistance Is this a change from baseline?: Change from baseline, expected to last >3 days Feeding: Needs assistance Is this a change from baseline?: Change from baseline, expected to last >3 days Bathing: Needs assistance Is this a change from baseline?: Change from baseline, expected to last >3 days Toileting: Needs assistance Is this a change from baseline?: Change from baseline, expected to last >3days In/Out Bed: Needs assistance Is this a change from baseline?: Change from baseline, expected to last >3 days Walks in Home: Needs assistance Is this a change from baseline?: Change from baseline, expected to last >3 days Does  the patient have difficulty walking or climbing stairs?: Yes (secondary to weakness) Weakness of Legs: Both Weakness of Arms/Hands: Both  Permission Sought/Granted                  Emotional Assessment              Admission diagnosis:  Dehydration [E86.0] Hyperkalemia [E87.5] Weakness [R53.1] SOB (shortness of breath) [R06.02] Acute respiratory failure with hypoxia ([place]) [J96.01] Malignant neoplasm of hilus of left lung ([place]) [C34.02] Patient Active Problem List   Diagnosis Date Noted  . Protein-calorie malnutrition, severe 07/02/2020  . Acute respiratory failure with hypoxia ([place]) 07/01/2020  . Pressure injury of skin 07/01/2020  . Dehydration 07/13/2020  . Hyperkalemia 07/15/2020  . Hypercalcemia 06/29/2020  . Hypotension 06/29/2020  . Metastatic carcinoma ([place]) 06/29/2020  . Bone metastasis ([place]) 06/27/2020  . Health maintenance examination 04/28/2014  . GERD (gastroesophageal reflux disease) 07/22/2013  . Gout 07/22/2013  . Allergic rhinitis 07/22/2013  . Situational anxiety 07/22/2013  . Tobacco dependence 09/16/2011  . Colon cancer screening 09/16/2011  . HTN (hypertension), benign 06/28/2011   PCP:  Tammi Sou, MD Pharmacy:   [id number] [place], [place] - [address] [place] [id number]: [phone]: [phone]    Social Determinants of Health (SDOH) Interventions    Readmission Risk Interventions No flowsheet data found.

## 2020-07-02 NOTE — Progress Notes (Signed)
On leader rounding patient's wife at bedside with concerns for his care. Concern around aggressive nature of care given terminal diagnosis. Patients advanced directive provided to staff. MD paged to discuss goals of care. Patient and his wife discussing DNR code status. Patient's wife also expressed concern that she would be unable to care for him at home. Physical therapy involved awaiting discharge recommendations. MD at bedside discussing goals of care and patients wishes. Plan to discontinue MRI, transition to comfort care, and discharge to residential hospice. Case manage ron board as well.

## 2020-07-02 NOTE — Progress Notes (Signed)
OT Cancellation Note  Patient Details Name: CAMDON SAETERN MRN: [id number] DOB: 20-May-1956   Cancelled Treatment:    Reason Eval/Treat Not Completed: Other (comment) per chart review patient is transitioning to comfort care and plan to discharge to residential hospice. Will discontinue OT eval at this time, please re-consult if new needs arise.  Delbert Phenix OT Pager: [place] 07/02/2020, 11:48 AM

## 2020-07-02 NOTE — Evaluation (Signed)
Clinical/Bedside Swallow Evaluation Patient Details  Name: Todd Garner MRN: [id number] Date of Birth: July 14, 1956  Today's Date: 07/02/2020 Time: SLP Start Time (ACUTE ONLY): 0901 SLP Stop Time (ACUTE ONLY): 0931 SLP Time Calculation (min) (ACUTE ONLY): 30 min  Past Medical History:  Past Medical History:  Diagnosis Date  . Allergic rhinitis   . Anal condylomata   . Anxiety   . Cancer ([place]) 05/2020   widespread (lung, liver, abd nodes, pelvis, extending into glut soft tissues-->no tissue dx as of 06/17/20-->referred to onc 06/17/20.  Presented as low back pain and abnormal weight loss.  Marland Kitchen GERD (gastroesophageal reflux disease)   . Gout   . HTN (hypertension)   . Hyperlipidemia 2017   Improved with TLC 11/2016  . IFG (impaired fasting glucose) 2016   A1c 5.2% 05/2015  . Palpable abdominal aorta 06/28/2011   Aortic u/s 07/03/11 showed NO AAA.  . Tobacco dependence    Trial of chantix 07/2017.  Ongoing, although much less, as of 07/2018.   Past Surgical History:  Past Surgical History:  Procedure Laterality Date  . anal wart excision    . ESOPHAGOGASTRODUODENOSCOPY  approx mid 1990s   pt reports normal.  . SKIN CANCER EXCISION  05/2011   SSC from left side of face/neck   HPI:  64 yo male admitted 06/24/2020 due to falls. PMH: metastatic cancer (lung, liver, abd nodes, pelvis), GERD, HTN, HLD, anxiety, HTN, tobacco abuse, markedly decreased PO intake, oral thrush. Denies chest pain or sob. Pt with choking on food/liquid 06/27/20.   Assessment / Plan / Recommendation Clinical Impression  Currently pt demonstrating clinical indications of gross pharyngeal dysphagia with suspected aspiration of small amount of cranberry juice via cup bolus.  His voice is severely dysphonic and he reports this to be present since March 2021 - progressive in nature - which raises concern for RLN involvement.  He does admit that current respiratory event is much worse than his norm.  Multiple swallows noted  across all boluses raising concern for pharyngeal retention.  RN arrived during session and oxygen was increased to accommodate dyspnea/hypoxia.    Recommend pt be npo except single small ice chips.  NO po medications advised - via IV.  Pt reports dysphagia progressive since May 2021.     Educated pt to recommendations to which he concurred.  Provided him with written compensation strategies/recommendations with teach back. Concern for prognosis of swallow function is present given his medical diagnosis and severity of deficit.  Suspect esophagitis is impacting his swallow function but not to gross level. SLP Visit Diagnosis: Dysphagia, pharyngeal phase (R13.13)    Aspiration Risk  Severe aspiration risk;Risk for inadequate nutrition/hydration    Diet Recommendation NPO   Medication Administration: Via alternative means Postural Changes: Seated upright at 90 degrees;Remain upright for at least 30 minutes after po intake    Other  Recommendations Oral Care Recommendations: Oral care prior to ice chip/H20   Follow up Recommendations None      Frequency and Duration      n/a      Prognosis   n/a     Swallow Study   General Date of Onset: 07/08/2020 HPI: 64 yo male admitted 07/08/2020 due to falls. PMH: metastatic cancer (lung, liver, abd nodes, pelvis), GERD, HTN, HLD, anxiety, HTN, tobacco abuse, markedly decreased PO intake, oral thrush. Denies chest pain or sob. Pt with choking on food/liquid 06/27/20. Type of Study: Bedside Swallow Evaluation Previous Swallow Assessment: none found Diet Prior to  this Study: NPO (except sips/medications) Respiratory Status: Nasal cannula (six liters) History of Recent Intubation: No Behavior/Cognition: Alert;Cooperative;Pleasant mood Oral Cavity Assessment: Dry;Other (comment) (oral cavity with white coating) Vision: Functional for self-feeding Self-Feeding Abilities: Able to feed self Patient Positioning: Upright in bed Baseline Vocal Quality:  Aphonic;Breathy;Suspected CN X (Vagus) involvement Volitional Cough: Weak Volitional Swallow: Unable to elicit    Oral/Motor/Sensory Function Overall Oral Motor/Sensory Function: Generalized oral weakness   Ice Chips Ice chips: Impaired Presentation: Spoon Pharyngeal Phase Impairments: Multiple swallows   Thin Liquid Thin Liquid: Not tested    Nectar Thick Nectar Thick Liquid: Impaired Presentation: Self Fed;Spoon;Cup Pharyngeal Phase Impairments: Multiple swallows;Suspected delayed Swallow;Cough - Delayed Other Comments: excessive prolonged cough noted after swallowing small bolus of nectar cranberry juice resulting in dyspnea, increased RR and decreased oxygen saturation, pt required several minutes to recover and expectorated viscous brown tinged secretions concerning for airway protection   Honey Thick Honey Thick Liquid: Not tested   Puree Puree: Not tested   Solid     Solid: Not tested      Macario Golds 07/02/2020,10:11 AM   Kathleen Lime, MS [place] Office [phone]

## 2020-07-02 NOTE — Progress Notes (Signed)
Initial Nutrition Assessment  DOCUMENTATION CODES:   Severe malnutrition in context of chronic illness  INTERVENTION:  - will monitor for plans concerning GOC/POC.  NUTRITION DIAGNOSIS:   Severe Malnutrition related to chronic illness, cancer and cancer related treatments as evidenced by severe fat depletion, severe muscle depletion, moderate muscle depletion.  GOAL:   Patient will meet greater than or equal to 90% of their needs  MONITOR:   Diet advancement, Weight trends  REASON FOR ASSESSMENT:   Malnutrition Screening Tool, Consult Assessment of nutrition requirement/status  ASSESSMENT:   64 year-old male with medical history of metastatic cancer on palliative XRT, anxiety, HTN, and tobacco abuse. He presented to the ED with poor p.o. intake, weight loss, and fall. In the ED, family reported to staff that he had a choking episode on 7/11. Notes indicate suspecting aspiration PNA.  Patient has been NPO since admission. No family/visitors present at the time of RD visit. MD currently in talking with patient and family. Flow sheet documentation indicates patient is a/o to self only. This seemed accurate based on interaction with patient.   Per chart review, weight yesterday was 132 lb. Weight on 7/8 was 136 lb. This indicates 4 lb weight loss (2.9% body weight) in the past 1 week; significant for time frame.  SLP saw patient this AM and recommended he remain NPO.    Labs reviewed; CBGs: 99 x2 mg/dl, BUN: 29 mg/dl, creatinine: 0.58 mg/dl, Ca: 8.7 mg/dl, Mg: 1.5 mg/dl, Alk Phos and AST elevated. Medications reviewed; 2 g IV MG sulfate x1 run 7/16, 40 mg oral protonix/day. IVF; D5-1/2 NS @ 50 ml/hr (204 kcal).     NUTRITION - FOCUSED PHYSICAL EXAM:    Most Recent Value  Orbital Region Severe depletion  Upper Arm Region Severe depletion  Thoracic and Lumbar Region Severe depletion  Buccal Region Severe depletion  Temple Region Severe depletion  Clavicle Bone Region  Severe depletion  Clavicle and Acromion Bone Region Severe depletion  Scapular Bone Region Unable to assess  Dorsal Hand Moderate depletion  Patellar Region Moderate depletion  Anterior Thigh Region Moderate depletion  Posterior Calf Region Moderate depletion  Edema (RD Assessment) None  Hair Reviewed  Eyes Reviewed  Mouth Unable to assess  Skin Reviewed  Nails Reviewed       Diet Order:   Diet Order            Diet NPO time specified Except for: Sips with Meds  Diet effective now                 EDUCATION NEEDS:   No education needs have been identified at this time  Skin:  Skin Assessment: Skin Integrity Issues: Skin Integrity Issues:: Stage I Stage I: R heel; sacrum; R back  Last BM:  PTA/unknown  Height:   Ht Readings from Last 1 Encounters:  07/01/20 5' 9"  (1.753 m)    Weight:   Wt Readings from Last 1 Encounters:  07/01/20 60.1 kg     Estimated Nutritional Needs:  Kcal:  2000-2200 kcal Protein:  100-110 grams Fluid:  >/= 2 L/day     Todd Matin, MS, RD, LDN, CNSC Inpatient Clinical Dietitian RD pager # available in [facility]  After hours/weekend pager # available in [facility]

## 2020-07-02 NOTE — Progress Notes (Signed)
[place] CSW Progress Notes  CSW received outpatient consult - noted that patient now plans to discharge to residential hospice.  Inpatient TOC team following.  CSW closing outpatient referral.  Edwyna Shell, LCSW Clinical Social Worker Phone:  [phone]

## 2020-07-02 NOTE — Progress Notes (Signed)
Manufacturing engineer [facility])   Referral received for residential hospice at [facility].  Spoke with wife Tammy, provided support and answered questions.    Currently, there is not a bed at [facility] and will not be one available for Saturday either.  Advised Tammy of this and that I will f/u with her and the hospital as soon as one is available.  Venia Carbon RN, BSN, [facility] Liaison

## 2020-07-03 LAB — GLUCOSE, CAPILLARY: Glucose-Capillary: 112 mg/dL — ABNORMAL HIGH (ref 70–99)

## 2020-07-03 MED ORDER — MORPHINE 100MG IN NS 100ML (1MG/ML) PREMIX INFUSION
2.0000 mg/h | INTRAVENOUS | Status: DC
  Administered 2020-07-03: 2 mg/h via INTRAVENOUS
  Filled 2020-07-03: qty 100

## 2020-07-06 ENCOUNTER — Inpatient Hospital Stay: Payer: BC Managed Care – PPO | Admitting: Nutrition

## 2020-07-06 ENCOUNTER — Ambulatory Visit: Payer: BC Managed Care – PPO | Admitting: Radiation Oncology

## 2020-07-06 LAB — CULTURE, BLOOD (ROUTINE X 2)
Culture: NO GROWTH
Culture: NO GROWTH
Special Requests: ADEQUATE

## 2020-07-07 ENCOUNTER — Ambulatory Visit: Payer: BC Managed Care – PPO

## 2020-07-08 ENCOUNTER — Ambulatory Visit: Payer: BC Managed Care – PPO

## 2020-07-09 ENCOUNTER — Ambulatory Visit: Payer: BC Managed Care – PPO

## 2020-07-12 ENCOUNTER — Ambulatory Visit: Payer: BC Managed Care – PPO

## 2020-07-13 ENCOUNTER — Ambulatory Visit: Payer: BC Managed Care – PPO

## 2020-07-14 ENCOUNTER — Ambulatory Visit: Payer: BC Managed Care – PPO | Admitting: Physician Assistant

## 2020-07-14 ENCOUNTER — Ambulatory Visit: Payer: BC Managed Care – PPO

## 2020-07-14 ENCOUNTER — Other Ambulatory Visit: Payer: BC Managed Care – PPO

## 2020-07-15 ENCOUNTER — Ambulatory Visit: Payer: BC Managed Care – PPO

## 2020-07-16 ENCOUNTER — Ambulatory Visit: Payer: BC Managed Care – PPO

## 2020-07-18 NOTE — Progress Notes (Signed)
Patient observed on rounds, noted to be absent of respirations and cardiac activity. Confirmed pts death with Rocco Serene RN. Time of death 03-17-18. Dr. Myna Hidalgo notified to complete death certificate. Hortencia Conradi RN

## 2020-07-18 NOTE — Death Summary Note (Signed)
[facility] Death Summary   Patient: Todd Garner ZJQ:[id number]   PCP: Tammi Sou, MD DOB: 20-Apr-1956    Admit Date:  2020-07-18  Date of Death: Date of Death: 2020/07/21  Time of Death: Time of Death: 03-14-2218  Length of Stay: 3   Hospital Diagnoses:  Principle Cause of death Sepsis from aspiration pneumonia   Active Problems:   Dehydration   HTN (hypertension), benign   Hypercalcemia   Metastatic carcinoma (HCC)   Hyperkalemia   Acute respiratory failure with hypoxia ([place])   Pressure injury of skin   Protein-calorie malnutrition, severe   History of present illness: As per the H and P dictated on admission, "Todd Garner is a 64 y.o. male with medical history significant of newly diagnosed metastatic cancer with biopsy pending, anxiety, HTN, tobacco abuse. Pt recently had been undergoing further work up of recently discovered metastatic disease. Family reports markedly decreased PO intake with progressive wt loss. Pt noted to have more falls over the past 2-3 days prior to admit, prompting aide from local [employer]. Pt was also recently treated for oral thrush. Denies chest pain or sob. Pt did note choking on food and liquids on 7/11  ED Course: In the ED, pt noted to have K of 6.2, lokelma ordered. Cr noted to be 0.67 with BUN trended up to 37. Patient was started on IVF hydration and hospitalist consulted for consideration for admission."  Hospital Course:  Patient with past medical history of metastatic cancer with pending biopsy result, anxiety, HTN, tobacco abuse.  Presenting with poor p.o. intake and weight loss as well as fall.  Reportedly had choking episode at home and also in the ER. Progressively Became hypoxic and requiring nonrebreather. Treated for aspiration pneumonia. But due to worsening respiratory status as well poor prognosis transitioned to comfort care.    1. Sepsis secondary to aspiration pneumonia, POA Tachypneic, tachycardic, temp of  101.   Requiring nonrebreather and leukocytosis.   Chest x-ray showing evidence of right-sided pneumonia. N.p.o. for now. Speech therapy consulted.  Patient at risk for swallowing any consistency of the food. no oral medications. Treated with IV Unasyn. No growth in blood culture.  2. Dehydration Given IV fluid. BNP slightly elevated.  3. Hyperkalemia Corrected.   Currently comfort measures  4. Goals of care conversation. Multiple conversations for goals of care. Discussed with wife at bedside in presence of patient. Followed by conversation with patient and wife separately followed by conversation between wife patient and daughter at bedside. Wife brought in patient's living will which mentions that that if I have a terminal illness and is unable to make any decision for myself I would prefer to be comfort care. Patient also clearly declined artificial nutrition or feeding tube in the living will. Patient has stage IV cancer with significant aspiration risk, severe protein calorie malnutrition and cachexia with poor reserve. Any option for treatment would be palliative intent only and with patient's current performance status patient likely not be candidate for chemotherapy. Patient has uncontrolled pain and significant symptom burden from his pneumonia and sepsis. Initial reason for presentation was confusion which requires CT of the head which showed skull base metastasis with vertebral artery compression as well as foramen magnum compression.  I also suspect possibility of for brain metastasis.  Patient delirious and confused. Discussed with patient he would like to be transition to comfort care and made DNR/DNI. Initially preferred to go home but after discussion with concern for significant symptom  burden as well as difficulty managing symptoms at home with his dysphagia and aspiration risk suggested patient will benefit from going to residential hospice. Patient's prognosis is  significantly guarded and less than 2 weeks given his poor p.o. intake, pneumonia with sepsis and aggressive cancer which is stage IV. Due to increased symptom burden despite IV morphine PRN pt was placed on scheduled morphine  infusion   5. Hypoglycemia From poor p.o. intake and n.p.o. status due to swallowing concern. Patient was on D5 half-normal saline. Currently comfort measures  6. Dysphagia Speech therapy consulted. Patient at risk for any consistency of the diet. Likely from fatigue as well as cancer. Currently comfort measures  7. Metastatic lung cancer. Biopsy now showing poorly differentiated carcinoma with neuroendocrine features. Due to present severe hypoxia CT PE protocol was performed which is negative for any acute abnormality. Family was informed that this is a stage IV lung cancer. CT head is also positive for skull base metastasis. CT chest abdomen shows no evidence of spinal metastasis for now. Discussed with medical oncology. No further work-up recommended for now. Currently comfort measures  8. Essential hypertension Currently comfort measures  The patient was pronounced deceased at 22:19, on July 19, 2020.  Procedures and Results:  none   Consultations:  none  The results of significant diagnostics from this hospitalization (including imaging, microbiology, ancillary and laboratory) are listed below for reference.    Significant Diagnostic Studies: DG Chest 1 View  Result Date: 07/04/2020 CLINICAL DATA:  Cough and shortness of breath. Recent diagnosis of metastatic lung cancer. EXAM: CHEST  1 VIEW COMPARISON:  Chest CT 06/17/2020 FINDINGS: The heart is normal in size. Stable appearing left hilar mass. No acute superimposed pulmonary findings such as pleural effusion or pneumothorax. No pulmonary edema. The bony thorax is grossly intact. IMPRESSION: 1. Stable left hilar mass. 2. No acute pulmonary findings. Electronically Signed   By: Marijo Sanes  M.D.   On: 07/11/2020 12:41   CT Head Wo Contrast  Result Date: 06/26/2020 CLINICAL DATA:  Altered mental status.  Metastatic lung cancer. EXAM: CT HEAD WITHOUT CONTRAST TECHNIQUE: Contiguous axial images were obtained from the base of the skull through the vertex without intravenous contrast. COMPARISON:  None. FINDINGS: Brain: The brain itself has a normal appearance without evidence of old or acute infarction, mass lesion, hemorrhage, hydrocephalus or extra-axial collection. In the left frontal subarachnoid space, there is a vascular calcification not felt to be of clinical significance. Vascular: There is atherosclerotic calcification of the major vessels at the base of the brain. Skull: No calvarial lesion is seen. There is a lytic metastasis at the skull base on the right with involvement of the occipital condyle and C1 vertebra. There is a soft tissue mass associated with this measuring over 5 cm in size and with potential to affect the right vertebral artery. Extradural tumor within the right lateral aspect the foramen magnum but without apparent compression of the brainstem. Sinuses/Orbits: Clear/normal Other: None IMPRESSION: Normal appearance of the brain itself. Metastatic disease affecting the skull base and C1 vertebra on the right with a soft tissue mass measuring over 5 cm in size. Potential for compromise of the right vertebral artery because of this. Extradural tumor encroachment upon the right side of the foramen magnum but without brainstem compression Electronically Signed   By: [name] Chimes M.D.   On: 07/17/2020 13:12   CT Chest W Contrast  Result Date: 06/17/2020 CLINICAL DATA:  Abnormal weight loss, smoker, hepatomegaly on exam. Palpable  soft tissue mass and left lower back gluteal region. EXAM: CT CHEST, ABDOMEN, AND PELVIS WITH CONTRAST TECHNIQUE: Multidetector CT imaging of the chest, abdomen and pelvis was performed following the standard protocol during bolus administration of  intravenous contrast. CONTRAST:  18mL ISOVUE-300 IOPAMIDOL (ISOVUE-300) INJECTION 61% COMPARISON:  None. FINDINGS: CT CHEST FINDINGS Cardiovascular: Heart is normal size. Aorta is normal caliber. Coronary artery and aortic calcifications. No aneurysm. Mediastinum/Nodes: Large conglomerate mass in the left hilum and left upper lobe measuring 6.8 x 4.5 cm. AP window conglomerate adenopathy measures up to 2 cm in short axis diameter. Subcarinal adenopathy. No right hilar or axillary adenopathy. Lungs/Pleura: Centrilobular emphysema. Conglomerate central left upper lobe and left hilar nodal mass again noted as measured above. Remainder the lungs are clear. No effusions. Musculoskeletal: Chest wall soft tissues are unremarkable. CT ABDOMEN PELVIS FINDINGS Hepatobiliary: Large masses throughout the liver compatible with metastases. The largest measures 11 cm in the right hepatic lobe. Gallbladder unremarkable. Pancreas: No focal abnormality or ductal dilatation. Spleen: No focal abnormality.  Normal size. Adrenals/Urinary Tract: No adrenal abnormality. No focal renal abnormality. No stones or hydronephrosis. Urinary bladder is unremarkable. Stomach/Bowel: Normal appendix. Stomach, large and small bowel grossly unremarkable. Vascular/Lymphatic: Diffuse aortic atherosclerosis. No aneurysm. Bulky retroperitoneal adenopathy. Left periaortic lymph node has a short axis diameter of 2.6 cm. Bulky porta hepatis adenopathy with short axis diameter 3.1 cm. Bilateral iliac nodal adenopathy, left greater than right with index left iliac lymph node having a short axis diameter of 2.4 cm. Reproductive: Mildly prominent prostate with central calcifications. Other: No free fluid or free air. Musculoskeletal: Large left gluteal mass measuring up to 8.2 cm. This likely arises from the left iliac bone with bone destruction and extension into the gluteal soft tissues. This also extends across the left SI joint and involves the left sacrum.  Large metastasis in the right iliac bone with soft tissue extension, measuring up to 4.3 cm. IMPRESSION: Large left hilar and central left upper lobe conglomerate mass most compatible with primary lung cancer. Associated mediastinal adenopathy. Numerous large metastases throughout the liver measuring up to 11 cm. Bulky porta hepatis and retroperitoneal/pelvic adenopathy. Large bone metastases in the iliac bones bilaterally. These are associated with bone destruction and extension into the adjacent soft tissues. Aortic atherosclerosis, coronary artery disease. Prostate enlargement with central calcifications. These results will be called to the ordering clinician or representative by the Radiologist Assistant, and communication documented in the PACS or [employer]. Electronically Signed   By: Rolm Baptise M.D.   On: 06/17/2020 15:29   CT ANGIO CHEST PE W OR WO CONTRAST  Result Date: 07/01/2020 CLINICAL DATA:  Shortness of breath EXAM: CT ANGIOGRAPHY CHEST WITH CONTRAST TECHNIQUE: Multidetector CT imaging of the chest was performed using the standard protocol during bolus administration of intravenous contrast. Multiplanar CT image reconstructions and MIPs were obtained to evaluate the vascular anatomy. CONTRAST:  186mL OMNIPAQUE IOHEXOL 350 MG/ML SOLN COMPARISON:  06/17/2020 FINDINGS: Cardiovascular: No filling defects in the pulmonary arteries to suggest pulmonary emboli. Heart is normal size. Coronary artery and aortic atherosclerosis. No evidence of aortic aneurysm. Mediastinum/Nodes: Bulky mediastinal and left hilar adenopathy again noted, unchanged. Trachea and esophagus are unremarkable. Thyroid unremarkable. Lungs/Pleura: Large central left upper lobe mass is again seen, unchanged. Consolidation noted in the right lower lobe, new since prior study most compatible with pneumonia. Small right pleural effusion. Upper Abdomen: Multiple masses within the liver, better seen on prior study. Musculoskeletal:  No acute bony abnormality. Review  of the MIP images confirms the above findings. IMPRESSION: Large left upper lobe/hilar mass with bulky left hilar and mediastinal adenopathy most compatible with metastatic lung cancer. This is unchanged since recent studies. Consolidation in the posterior right upper lobe compatible with pneumonia. Small right pleural effusion. Coronary artery disease. Aortic Atherosclerosis (ICD10-I70.0). Electronically Signed   By: Rolm Baptise M.D.   On: 07/01/2020 12:01   CT Abdomen Pelvis W Contrast  Result Date: 06/17/2020 CLINICAL DATA:  Abnormal weight loss, smoker, hepatomegaly on exam. Palpable soft tissue mass and left lower back gluteal region. EXAM: CT CHEST, ABDOMEN, AND PELVIS WITH CONTRAST TECHNIQUE: Multidetector CT imaging of the chest, abdomen and pelvis was performed following the standard protocol during bolus administration of intravenous contrast. CONTRAST:  12mL ISOVUE-300 IOPAMIDOL (ISOVUE-300) INJECTION 61% COMPARISON:  None. FINDINGS: CT CHEST FINDINGS Cardiovascular: Heart is normal size. Aorta is normal caliber. Coronary artery and aortic calcifications. No aneurysm. Mediastinum/Nodes: Large conglomerate mass in the left hilum and left upper lobe measuring 6.8 x 4.5 cm. AP window conglomerate adenopathy measures up to 2 cm in short axis diameter. Subcarinal adenopathy. No right hilar or axillary adenopathy. Lungs/Pleura: Centrilobular emphysema. Conglomerate central left upper lobe and left hilar nodal mass again noted as measured above. Remainder the lungs are clear. No effusions. Musculoskeletal: Chest wall soft tissues are unremarkable. CT ABDOMEN PELVIS FINDINGS Hepatobiliary: Large masses throughout the liver compatible with metastases. The largest measures 11 cm in the right hepatic lobe. Gallbladder unremarkable. Pancreas: No focal abnormality or ductal dilatation. Spleen: No focal abnormality.  Normal size. Adrenals/Urinary Tract: No adrenal abnormality. No  focal renal abnormality. No stones or hydronephrosis. Urinary bladder is unremarkable. Stomach/Bowel: Normal appendix. Stomach, large and small bowel grossly unremarkable. Vascular/Lymphatic: Diffuse aortic atherosclerosis. No aneurysm. Bulky retroperitoneal adenopathy. Left periaortic lymph node has a short axis diameter of 2.6 cm. Bulky porta hepatis adenopathy with short axis diameter 3.1 cm. Bilateral iliac nodal adenopathy, left greater than right with index left iliac lymph node having a short axis diameter of 2.4 cm. Reproductive: Mildly prominent prostate with central calcifications. Other: No free fluid or free air. Musculoskeletal: Large left gluteal mass measuring up to 8.2 cm. This likely arises from the left iliac bone with bone destruction and extension into the gluteal soft tissues. This also extends across the left SI joint and involves the left sacrum. Large metastasis in the right iliac bone with soft tissue extension, measuring up to 4.3 cm. IMPRESSION: Large left hilar and central left upper lobe conglomerate mass most compatible with primary lung cancer. Associated mediastinal adenopathy. Numerous large metastases throughout the liver measuring up to 11 cm. Bulky porta hepatis and retroperitoneal/pelvic adenopathy. Large bone metastases in the iliac bones bilaterally. These are associated with bone destruction and extension into the adjacent soft tissues. Aortic atherosclerosis, coronary artery disease. Prostate enlargement with central calcifications. These results will be called to the ordering clinician or representative by the Radiologist Assistant, and communication documented in the PACS or [employer]. Electronically Signed   By: Rolm Baptise M.D.   On: 06/17/2020 15:29   CT Biopsy  Result Date: 06/21/2020 INDICATION: Advanced metastatic disease of uncertain primary etiology. Imaging findings are suspicious for small cell lung cancer. EXAM: CT-guided biopsy MEDICATIONS: None.  ANESTHESIA/SEDATION: Moderate (conscious) sedation was employed during this procedure. A total of Versed 1 mg and Fentanyl 50 mcg was administered intravenously. Moderate Sedation Time: 10 minutes. The patient's level of consciousness and vital signs were monitored continuously by radiology nursing throughout the  procedure under my direct supervision. FLUOROSCOPY TIME:  None COMPLICATIONS: None immediate. PROCEDURE: Informed written consent was obtained from the patient after a thorough discussion of the procedural risks, benefits and alternatives. All questions were addressed. Maximal Sterile Barrier Technique was utilized including caps, mask, sterile gowns, sterile gloves, sterile drape, hand hygiene and skin antiseptic. A timeout was performed prior to the initiation of the procedure. A planning axial CT scan was performed. The large exophytic soft tissue mass arising from the left iliac bone was successfully identified. The overlying skin was sterilely prepped and draped in the standard fashion using chlorhexidine skin prep. Local anesthesia was attained by infiltration with 1% lidocaine. A small dermatotomy was made. A 17 gauge introducer needle was advanced into the margin of the mass. Multiple 18 gauge core biopsies were then obtained coaxially using the bio Pince automated biopsy device. Biopsy specimens were placed in formalin and delivered to pathology for further analysis. IMPRESSION: Successful CT-guided core biopsy of exophytic left iliac mass. Electronically Signed   By: Jacqulynn Cadet M.D.   On: 07/01/2020 10:41   DG CHEST PORT 1 VIEW  Result Date: 07/01/2020 CLINICAL DATA:  Cough, shortness of breath and low oxygen saturation. EXAM: PORTABLE CHEST 1 VIEW COMPARISON:  06/22/2020 FINDINGS: Heart size is normal in stable. Stable left hilar mass. New right upper lobe airspace process worrisome for pneumonia. No pleural effusions or pneumothorax. IMPRESSION: 1. New right upper lobe infiltrate. 2.  Stable left hilar mass. Electronically Signed   By: Marijo Sanes M.D.   On: 07/01/2020 07:00    Microbiology: Recent Results (from the past 240 hour(s))  SARS Coronavirus 2 by RT PCR (hospital order, performed in [facility] hospital lab) Nasopharyngeal Nasopharyngeal Swab     Status: None   Collection Time: 07/09/2020  4:11 PM   Specimen: Nasopharyngeal Swab  Result Value Ref Range Status   SARS Coronavirus 2 NEGATIVE NEGATIVE Final    Comment: (NOTE) SARS-CoV-2 target nucleic acids are NOT DETECTED.  The SARS-CoV-2 RNA is generally detectable in upper and lower respiratory specimens during the acute phase of infection. The lowest concentration of SARS-CoV-2 viral copies this assay can detect is 250 copies / mL. A negative result does not preclude SARS-CoV-2 infection and should not be used as the sole basis for treatment or other patient management decisions.  A negative result may occur with improper specimen collection / handling, submission of specimen other than nasopharyngeal swab, presence of viral mutation(s) within the areas targeted by this assay, and inadequate number of viral copies (<250 copies / mL). A negative result must be combined with clinical observations, patient history, and epidemiological information.  Fact Sheet for Patients:   StrictlyIdeas.no  Fact Sheet for Healthcare Providers: BankingDealers.co.za  This test is not yet approved or  cleared by the [place] FDA and has been authorized for detection and/or diagnosis of SARS-CoV-2 by FDA under an Emergency Use Authorization (EUA).  This EUA will remain in effect (meaning this test can be used) for the duration of the COVID-19 declaration under Section 564(b)(1) of the Act, 21 U.S.C. section 360bbb-3(b)(1), unless the authorization is terminated or revoked sooner.  Performed at [facility], [place] [address] [place], [place]  [address]   Culture, blood (routine x 2)     Status: None (Preliminary result)   Collection Time: 07/01/20  9:37 AM   Specimen: BLOOD  Result Value Ref Range Status   Specimen Description   Final    BLOOD BLOOD  RIGHT FOREARM Performed at [facility], [place] [address] [place], [place] [address]    Special Requests   Final    BOTTLES DRAWN AEROBIC AND ANAEROBIC Blood Culture results may not be optimal due to an inadequate volume of blood received in culture bottles Performed at [place] [address] [place], [place] [address]    Culture   Final    NO GROWTH 2 DAYS Performed at [place] [address], [place], [place] [address]    Report Status PENDING  Incomplete  Culture, blood (routine x 2)     Status: None (Preliminary result)   Collection Time: 07/01/20  9:39 AM   Specimen: BLOOD  Result Value Ref Range Status   Specimen Description   Final    BLOOD BLOOD RIGHT FOREARM Performed at [facility] [address], [place], [place] [address]    Special Requests   Final    BOTTLES DRAWN AEROBIC AND ANAEROBIC Blood Culture adequate volume Performed at [place] [address] [place], [place] [address]    Culture   Final    NO GROWTH 2 DAYS Performed at [facility] [address] [place], [place] [address]    Report Status PENDING  Incomplete  MRSA PCR Screening     Status: None   Collection Time: 07/01/20  4:30 PM   Specimen: Nasal Mucosa; Nasopharyngeal  Result Value Ref Range Status   MRSA by PCR NEGATIVE NEGATIVE Final    Comment:        The GeneXpert MRSA Assay (FDA approved for NASAL specimens only), is one component of a comprehensive MRSA colonization surveillance program. It is not intended to diagnose MRSA infection nor to guide or monitor treatment for MRSA infections. Performed at [facility], [place] [address], [place], [place]  [address]      Labs: CBC: Recent Labs  Lab 06/29/20 [phone] 06/21/2020 0807 07/14/2020 2353 07/01/20 0445 07/02/20 0224  WBC 9.3 9.2 11.1* 11.7* 10.2  NEUTROABS 7.8* 8.2*  --   --  8.9*  HGB 14.3 14.0 13.3 13.2 12.2*  HCT 41.5 41.1 39.3 39.5 36.9*  MCV 83.2 85.1 84.2 85.1 84.6  PLT 359 330 295 289 638   Basic Metabolic Panel: Recent Labs  Lab 06/29/20 0939 06/17/2020 1304 06/17/2020 2353 07/01/20 0445 07/02/20 0224  NA 130* 135  --  136 140  K 5.3* 6.2*  --  4.1 4.0  CL 94* 97*  --  102 108  CO2 17* 23  --  20* 21*  GLUCOSE 73 71  --  66* 104*  BUN 35* 37*  --  30* 29*  CREATININE 0.81 0.67 0.50* 0.51* 0.58*  CALCIUM 12.1* 10.6*  --  9.4 8.7*  MG  --   --   --   --  1.5*   Liver Function Tests: Recent Labs  Lab 06/29/20 0939 06/20/2020 1304 07/01/20 0445 07/02/20 0224  AST 113* 132* 130* 116*  ALT 28 33 32 31  ALKPHOS 459* 362* 333* 271*  BILITOT 1.5* 2.0* 1.3* 1.7*  PROT 6.5 5.6* 5.4* 5.2*  ALBUMIN 2.7* 2.6* 2.4* 2.2*   Cardiac Enzymes: No results for input(s): CKTOTAL, CKMB, CKMBINDEX, TROPONINI in the last 168 hours.  Time spent: 30 minutes  Signed:  Berle Mull  [facility]  07-16-2020

## 2020-07-18 NOTE — Progress Notes (Signed)
[facility] Progress Note  Patient: Todd Garner    RCV:[id number]  DOA: 07/01/2020     Date of Service: the patient was seen and examined on 2020-07-12  Brief hospital course: Patient with past medical history of metastatic cancer with pending biopsy result, anxiety, HTN, tobacco abuse.  Presenting with poor p.o. intake and weight loss as well as fall.  Reportedly had a choking episode on 06/27/2020 as well as in the ER on admission. Become hypoxic and requiring nonrebreather.  Suspecting to have aspiration pneumonia Currently plan is continue comfort care.  Anticipating in hospital death.  Assessment and Plan: 1.  Sepsis secondary to aspiration pneumonia, POA Tachypneic, tachycardic, temp of 101.  Requiring nonrebreather and leukocytosis.  Chest x-ray showing evidence of right-sided pneumonia. All suspicious for aspiration. N.p.o. for now. Speech therapy consulted.  Patient at risk for swallowing any consistency of the food.  Currently no oral medications. Oxygenation improved from nonrebreather to 6 L and currently stable on 6 L of oxygen. Significant tachypnea still exist. Treated with IV Unasyn. No growth in blood culture.  2.  Dehydration Given IV fluid. Continue to monitor.  BNP slightly elevated.  3.  Comfort measures. After extensive discussion with family and patient patient is currently on comfort measures. Initial plan was to go to residential hospice. Given patient's rapid decline likely dissipating in hospital death Appropriate hours. Patient significantly tachypneic and tachycardic right now At this is despite receiving morphine earlier this morning. At present patient will benefit from being on scheduled morphine and consult drip. Discussed with wife we will start the patient on 2 mg IV morphine drip. Return to increased depending on patient response. Continue bolus regimen.  4.  Hyperkalemia Corrected.  Currently comfort measures  5.  Hypoglycemia From  poor p.o. intake and n.p.o. status due to swallowing concern. Patient was on D5 half-normal saline. Currently comfort measures  6.  Dysphagia Speech therapy consulted. Patient at risk for any consistency of the diet. Likely from fatigue as well as cancer. Currently comfort measures  7.  Metastatic lung cancer. Biopsy now showing poorly differentiated carcinoma with neuroendocrine features. Due to present severe hypoxia CT PE protocol was performed which is negative for any acute abnormality. Family was informed that this is a stage IV lung cancer. CT head is also positive for skull base metastasis. CT chest abdomen shows no evidence of spinal metastasis for now. Discussed with medical oncology. No further work-up recommended for now. Currently comfort measures  8.  Goals of care conversation. Multiple conversations on 07/02/2020 for goals of care. Discussed with wife at bedside in presence of patient. Followed by conversation with patient and wife separately followed by conversation between wife patient and daughter at bedside. Wife brought in patient's living will which mentions that that if I have a terminal illness and is unable to make any decision for myself I would prefer to be comfort care. Patient also categorically declined artificial nutrition or feeding tube in the living will. Patient has stage IV cancer with significant aspiration risk, severe protein calorie malnutrition and cachexia with poor reserve. Any option for treatment would be palliative intent only and with patient's current performance status not sure whether patient will be candidate for chemotherapy. Patient has uncontrolled pain and significant symptom burden from his pneumonia and sepsis. Initial reason for presentation was confusion which requires CT of the head which showed skull base metastasis with vertebral artery compression as well as foramen magnum compression.  I also suspect possibility  of for brain  metastasis.  Patient currently delirious and occasionally confused. Discussed with patient he would like to be transition to comfort care and made DNR/DNI. Initially preferred to go home but after discussion with concern for significant symptom burden as well as difficulty managing symptoms at home with his dysphagia and aspiration risk suggested patient will benefit from going to residential hospice. Patient's prognosis is significantly guarded and less than 2 weeks given his poor p.o. intake, pneumonia with sepsis and aggressive cancer which is stage IV.  9. Essential hypertension Currently comfort measures  Diet: N.p.o. comfort feed. DVT Prophylaxis: Comfort care   Advance goals of care discussion: DNR/DNI comfort care  Family Communication: family was present at bedside, at the time of interview.  The pt provided permission to discuss medical plan with the family. Opportunity was given to ask question and all questions were answered satisfactorily.   Disposition:  Status is: Inpatient  Remains inpatient appropriate because:Hemodynamically unstable and Altered mental status  Dispo: The patient is from: Home              Anticipated d/c is to: Anticipating in hospital death the next 24 to 48 hours               Patient currently is not medically stable to d/c.  Subjective: Lethargic.  Short of breath.  No nausea no vomiting.  Physical Exam:  General: Appear in marked distress, no Rash; Oral Mucosa Clear, dry. no Abnormal Neck Mass Or lumps, Conjunctiva normal  Cardiovascular: S1 and S2 Present, no Murmur, Respiratory: increased respiratory effort, Bilateral Air entry present and bilateral  Crackles, no wheezes Abdomen: Bowel Sound present Extremities: no Pedal edema,  Neurology: obtunded and not oriented to time, place, and person affect unresponsive.   Vitals:   07/02/20 1100 07/02/20 1200 07/02/20 1400 13-Jul-2020 0621  BP: (!) 112/52 (!) 114/55 (!) 107/59 (!) 105/58  Pulse:   95 (!) 105 (!) 115  Resp: (!) 31 (!) 24 20 (!) 28  Temp:   97.7 F (36.5 C) 97.8 F (36.6 C)  TempSrc:   Oral Oral  SpO2:  91% (!) 74% (!) 78%  Weight:      Height:        Intake/Output Summary (Last 24 hours) at 13-Jul-2020 1818 Last data filed at 07/13/20 1700 Gross per 24 hour  Intake 0 ml  Output 325 ml  Net -325 ml   Filed Weights   07/08/2020 1203 07/01/20 1637  Weight: 56.7 kg 60.1 kg    Data Reviewed: I have personally reviewed and interpreted daily labs, tele strips, imagings as discussed above. I reviewed all nursing notes, pharmacy notes, vitals, pertinent old records I have discussed plan of care as described above with RN and patient/family.  CBC: Recent Labs  Lab 06/29/20 0939 07/02/2020 0807 07/01/2020 2353 07/01/20 0445 07/02/20 0224  WBC 9.3 9.2 11.1* 11.7* 10.2  NEUTROABS 7.8* 8.2*  --   --  8.9*  HGB 14.3 14.0 13.3 13.2 12.2*  HCT 41.5 41.1 39.3 39.5 36.9*  MCV 83.2 85.1 84.2 85.1 84.6  PLT 359 330 295 289 161   Basic Metabolic Panel: Recent Labs  Lab 06/29/20 0939 06/17/2020 1304 06/29/2020 2353 07/01/20 0445 07/02/20 0224  NA 130* 135  --  136 140  K 5.3* 6.2*  --  4.1 4.0  CL 94* 97*  --  102 108  CO2 17* 23  --  20* 21*  GLUCOSE 73 71  --  66* 104*  BUN 35* 37*  --  30* 29*  CREATININE 0.81 0.67 0.50* 0.51* 0.58*  CALCIUM 12.1* 10.6*  --  9.4 8.7*  MG  --   --   --   --  1.5*    Studies: No results found.  Scheduled Meds:  chlorhexidine  15 mL Mouth Rinse BID   Chlorhexidine Gluconate Cloth  6 each Topical Q0600   mouth rinse  15 mL Mouth Rinse q12n4p    morphine injection  2 mg Intravenous TID   pantoprazole  40 mg Oral Daily   scopolamine  1 patch Transdermal Q72H   sodium chloride flush  3 mL Intravenous Q12H   Continuous Infusions:  sodium chloride     morphine 2 mg/hr (07-31-2020 1057)   PRN Meds: sodium chloride, acetaminophen **OR** acetaminophen, albuterol, glycopyrrolate **OR** glycopyrrolate **OR**  glycopyrrolate, haloperidol **OR** haloperidol **OR** haloperidol lactate, lip balm, LORazepam **OR** LORazepam **OR** LORazepam, LORazepam, magic mouthwash, morphine injection, ondansetron **OR** ondansetron (ZOFRAN) IV, sodium chloride flush  Time spent: 35 minutes for care coordination. On top of this 45 minutes spent discussion goals of care.  Author: Berle Mull, MD [facility] 31-Jul-2020 6:18 PM  To reach On-call, see care teams to locate the attending and reach out via [email or web address]. Between 7PM-7AM, please contact night-coverage If you still have difficulty reaching the attending provider, please page the [facility] (Director on Call) for [facility] on amion for assistance.

## 2020-07-18 NOTE — Progress Notes (Signed)
2 mg SL ativan received from pharmacy by this Probation officer. Pt required only 1 milligram per MD's order. Remaining 1 mg wasted in narcotic waste container by this Probation officer and witnessed by [employer].

## 2020-07-18 DEATH — deceased

## 2020-07-19 ENCOUNTER — Ambulatory Visit: Payer: BC Managed Care – PPO

## 2020-07-20 ENCOUNTER — Other Ambulatory Visit: Payer: Self-pay | Admitting: Family Medicine

## 2020-07-20 DIAGNOSIS — I10 Essential (primary) hypertension: Secondary | ICD-10-CM

## 2020-08-27 ENCOUNTER — Telehealth: Payer: Self-pay | Admitting: *Deleted

## 2020-08-27 NOTE — Telephone Encounter (Signed)
Received vm call from pt's wife, Tammy requesting hard caopy of Path report from ?7/14.  Message to HIM/Tia to f/u.
